# Patient Record
Sex: Female | Born: 1990 | Race: Black or African American | Hispanic: No | Smoking: Never smoker
Health system: Southern US, Community
[De-identification: ages and names within clinical notes are randomized; demographics above are authoritative.]

## PROBLEM LIST (undated history)

## (undated) DIAGNOSIS — F32A Depression, unspecified: Secondary | ICD-10-CM

## (undated) DIAGNOSIS — B009 Herpesviral infection, unspecified: Secondary | ICD-10-CM

## (undated) DIAGNOSIS — F329 Major depressive disorder, single episode, unspecified: Secondary | ICD-10-CM

## (undated) HISTORY — PX: INDUCED ABORTION: SHX677

## (undated) HISTORY — DX: Depression, unspecified: F32.A

## (undated) HISTORY — PX: WISDOM TOOTH EXTRACTION: SHX21

---
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
## 2013-02-22 ENCOUNTER — Encounter (HOSPITAL_COMMUNITY): Payer: Self-pay | Admitting: *Deleted

## 2013-02-22 ENCOUNTER — Inpatient Hospital Stay (HOSPITAL_COMMUNITY)
Admission: AD | Admit: 2013-02-22 | Discharge: 2013-02-22 | Disposition: A | Discharge status: Home / Self Care | Source: Ambulatory Visit | Attending: Obstetrics and Gynecology | Admitting: Obstetrics and Gynecology

## 2013-02-22 DIAGNOSIS — N912 Amenorrhea, unspecified: Secondary | ICD-10-CM

## 2013-02-22 DIAGNOSIS — Z3202 Encounter for pregnancy test, result negative: Secondary | ICD-10-CM | POA: Insufficient documentation

## 2013-02-22 LAB — URINALYSIS, ROUTINE W REFLEX MICROSCOPIC
Glucose, UA: NEGATIVE mg/dL
Hgb urine dipstick: NEGATIVE
Ketones, ur: NEGATIVE mg/dL
Leukocytes, UA: NEGATIVE
Nitrite: NEGATIVE
Protein, ur: NEGATIVE mg/dL
pH: 7 (ref 5.0–8.0)

## 2013-02-22 LAB — WET PREP, GENITAL
Clue Cells Wet Prep HPF POC: NONE SEEN
Trich, Wet Prep: NONE SEEN

## 2013-02-22 MED ORDER — METRONIDAZOLE 500 MG PO TABS: 500.0000 mg | ORAL_TABLET | Freq: Two times a day (BID) | ORAL | Status: DC

## 2013-02-22 NOTE — MAU Provider Note (Signed)
History     CSN: [id number]  Arrival date and time: 02/22/13 1446   First Provider Initiated Contact with Patient 02/22/13 1556      Chief Complaint  Patient presents with  . missed period    HPI  Pt is not pregnant and is G2P1with recent Misoprostol from [facility] on Aug 13 and bled from 13-29.  Pt then did not have a period and had a positive urine pregnancy test on Sept 29.  Pt has not taken another pregnancy test but has not had a period.  Pt has had Mirena IUD and had that removed and then did NuvaRing until December.  Pt denies vaginal discharge, spotting or  Bleeding. RN note: Patient presents to MAU today for a missed period; states unsure of last period; + HPT at home on 9/15. Patient states had +HPT on Aug 1st and [facility] [place] [facility]. States "I took at pill on Aug 13 and bled and cramped from Aug 13-Aug 29". [facility] and was told had possible tissue remaining? States was advised to come to the hospital [facility]. Denies bleeding nor pain at this time.  History reviewed. No pertinent past medical history.  History reviewed. No pertinent past surgical history.  History reviewed. No pertinent family history.  History  Substance Use Topics  . Smoking status: Never Smoker   . Smokeless tobacco: Never Used  . Alcohol Use: No    Allergies: No Known Allergies  Prescriptions prior to admission  Medication Sig Dispense Refill  . acetaminophen (TYLENOL) 325 MG tablet Take 650 mg by mouth every 6 (six) hours as needed for pain (headache).        Review of Systems  Constitutional: Negative for fever and chills.  Gastrointestinal: Negative for nausea, vomiting, abdominal pain, diarrhea and constipation.  Genitourinary: Negative for dysuria.   Physical Exam   Blood pressure 115/63, pulse 60, temperature 98.6 F (37 C),  temperature source Oral, resp. rate 18, height 5\' 5"  (1.651 m), SpO2 100.00%.  Physical Exam  Nursing note and vitals reviewed. Constitutional: She is oriented to person, place, and time. She appears well-developed and well-nourished. No distress.  HENT:  Head: Normocephalic.  Eyes: Pupils are equal, round, and reactive to light.  Neck: Normal range of motion. Neck supple.  Cardiovascular: Normal rate.   Respiratory: Effort normal.  GI: Soft. She exhibits no distension. There is no tenderness. There is no rebound and no guarding.  Genitourinary: Vagina normal and uterus normal. No vaginal discharge found.  Musculoskeletal: Normal range of motion.  Neurological: She is alert and oriented to person, place, and time.  Skin: Skin is warm and dry.  Psychiatric: She has a normal mood and affect.    MAU Course  Procedures Results for orders placed during the hospital encounter of 02/22/13 (from the past 24 hour(s))  URINALYSIS, ROUTINE W REFLEX MICROSCOPIC     Status: Abnormal   Collection Time    02/22/13  3:25 PM      Result Value Range   Color, Urine YELLOW  YELLOW   APPearance HAZY (*) CLEAR   Specific Gravity, Urine 1.020  1.005 - 1.030   pH 7.0  5.0 - 8.0   Glucose, UA NEGATIVE  NEGATIVE mg/dL   Hgb urine dipstick NEGATIVE  NEGATIVE   Bilirubin Urine NEGATIVE  NEGATIVE   Ketones, ur NEGATIVE  NEGATIVE mg/dL   Protein, ur NEGATIVE  NEGATIVE mg/dL   Urobilinogen, UA 0.2  0.0 - 1.0 mg/dL   Nitrite NEGATIVE  NEGATIVE   Leukocytes, UA NEGATIVE  NEGATIVE  POCT PREGNANCY, URINE     Status: None   Collection Time    02/22/13  3:47 PM      Result Value Range   Preg Test, Ur NEGATIVE  NEGATIVE  WET PREP, GENITAL     Status: Abnormal   Collection Time    02/22/13  4:10 PM      Result Value Range   Yeast Wet Prep HPF POC NONE SEEN  NONE SEEN   Trich, Wet Prep NONE SEEN  NONE SEEN   Clue Cells Wet Prep HPF POC NONE SEEN  NONE SEEN   WBC, Wet Prep HPF POC MODERATE (*) NONE SEEN     Assessment and Plan  Amenorrhea post misoprostol Negative pregnancy test Resume NuvaRing today and f/u with [facility] for Mirena (per pt choice)   Mayfield Schoene 02/22/2013, 3:58 PM

## 2013-02-22 NOTE — MAU Note (Signed)
Patient presents to MAU today for a missed period; states unsure of last period; + HPT at home on 9/15. Patient states had +HPT on Aug 1st and [facility] [place] [facility]. States "I took at pill on Aug 13 and bled and cramped from Aug 13-Aug 29". [facility] and was told had possible tissue remaining? States was advised to come to the [facility]. Denies bleeding nor pain at this time.

## 2013-02-23 LAB — GC/CHLAMYDIA PROBE AMP
CT Probe RNA: NEGATIVE
GC Probe RNA: NEGATIVE

## 2013-02-25 NOTE — MAU Provider Note (Signed)
Attestation of Attending Supervision of Advanced Practitioner (CNM/NP): Evaluation and management procedures were performed by the Advanced Practitioner under my supervision and collaboration.  I have reviewed the Advanced Practitioner's note and chart, and I agree with the management and plan.  Gracyn Santillanes 02/25/2013 2:17 PM

## 2013-03-02 ENCOUNTER — Emergency Department (INDEPENDENT_AMBULATORY_CARE_PROVIDER_SITE_OTHER)
Admission: EM | Admit: 2013-03-02 | Discharge: 2013-03-02 | Disposition: A | Discharge status: Home / Self Care | Source: Home / Self Care | Attending: Emergency Medicine | Admitting: Emergency Medicine

## 2013-03-02 ENCOUNTER — Encounter (HOSPITAL_COMMUNITY): Payer: Self-pay | Admitting: Emergency Medicine

## 2013-03-02 DIAGNOSIS — R05 Cough: Secondary | ICD-10-CM

## 2013-03-02 DIAGNOSIS — J00 Acute nasopharyngitis [common cold]: Secondary | ICD-10-CM

## 2013-03-02 DIAGNOSIS — R059 Cough, unspecified: Secondary | ICD-10-CM

## 2013-03-02 LAB — POCT RAPID STREP A: Streptococcus, Group A Screen (Direct): NEGATIVE

## 2013-03-02 MED ORDER — PSEUDOEPH-BROMPHEN-DM 30-2-10 MG/5ML PO SYRP: 10.0000 mL | ORAL_SOLUTION | Freq: Four times a day (QID) | ORAL | Status: DC | PRN

## 2013-03-02 MED ORDER — METHYLPREDNISOLONE 4 MG PO KIT: PACK | ORAL | Status: DC

## 2013-03-02 MED ORDER — FLUTICASONE PROPIONATE 50 MCG/ACT NA SUSP: 2.0000 | Freq: Every day | NASAL | Status: DC

## 2013-03-02 NOTE — ED Provider Notes (Signed)
CSN: [id number]     Arrival date & time 03/02/13  1542 History   First MD Initiated Contact with Patient 03/02/13 1651     Chief Complaint  Patient presents with  . Sore Throat   (Consider location/radiation/quality/duration/timing/severity/associated sxs/prior Treatment) HPI Comments: 22 year old female presents complaining of 3 days of sore throat, productive cough, and having lost her voice. She also has nasal congestion and some slight pressure in her sinuses. This is been aggravated by cold air. She has been taking over-the-counter medications with minimal relief. She denies fever, chills, NVD, chest pain, shortness of breath.  Patient is a 22 y.o. female presenting with pharyngitis.  Sore Throat Pertinent negatives include no chest pain, no abdominal pain and no shortness of breath.    History reviewed. No pertinent past medical history. History reviewed. No pertinent past surgical history. No family history on file. History  Substance Use Topics  . Smoking status: Never Smoker   . Smokeless tobacco: Never Used  . Alcohol Use: No   OB History   Grav Para Term Preterm Abortions TAB SAB Ect Mult Living   1 1 1       1      Review of Systems  Constitutional: Negative for fever and chills.  HENT: Positive for congestion, sinus pressure and sore throat.   Eyes: Negative for visual disturbance.  Respiratory: Positive for cough. Negative for shortness of breath.   Cardiovascular: Negative for chest pain, palpitations and leg swelling.  Gastrointestinal: Negative for nausea, vomiting and abdominal pain.  Endocrine: Negative for polydipsia and polyuria.  Genitourinary: Negative for dysuria, urgency and frequency.  Musculoskeletal: Negative for arthralgias and myalgias.  Skin: Negative for rash.  Neurological: Negative for dizziness, weakness and light-headedness.    Allergies  Review of patient's allergies indicates no known allergies.  Home Medications   Current  Outpatient Rx  Name  Route  Sig  Dispense  Refill  . acetaminophen (TYLENOL) 325 MG tablet   Oral   Take 650 mg by mouth every 6 (six) hours as needed for pain (headache).         . brompheniramine-pseudoephedrine-DM 30-2-10 MG/5ML syrup   Oral   Take 10 mLs by mouth 4 (four) times daily as needed.   120 mL   0   . fluticasone (FLONASE) 50 MCG/ACT nasal spray   Nasal   Place 2 sprays into the nose daily.   1 g   2   . methylPREDNISolone (MEDROL DOSEPAK) 4 MG tablet      follow package directions   21 tablet   0     Dispense as written.    BP 105/57  Pulse 64  Temp(Src) 98.7 F (37.1 C) (Oral)  Resp 18  SpO2 100%  LMP 12/21/2012 Physical Exam  Nursing note and vitals reviewed. Constitutional: She is oriented to person, place, and time. Vital signs are normal. She appears well-developed and well-nourished. No distress.  HENT:  Head: Normocephalic and atraumatic.  Right Ear: External ear normal.  Nose: Nose normal.  Mouth/Throat: Oropharynx is clear and moist. No oropharyngeal exudate.  Eyes: Pupils are equal, round, and reactive to light. No scleral icterus.  Neck: Normal range of motion. Neck supple.  Cardiovascular: Normal rate, regular rhythm and normal heart sounds.  Exam reveals no gallop and no friction rub.   No murmur heard. Pulmonary/Chest: Effort normal and breath sounds normal. No respiratory distress. She has no wheezes. She has no rales.  Abdominal: Soft. There is no tenderness.  Lymphadenopathy:    She has no cervical adenopathy.  Neurological: She is alert and oriented to person, place, and time. She has normal strength. Coordination normal.  Skin: Skin is warm and dry. No rash noted. She is not diaphoretic.  Psychiatric: She has a normal mood and affect. Judgment normal.    ED Course  Procedures (including critical care time) Labs Review Labs Reviewed - No data to display Imaging Review No results found.    MDM   1. Acute  nasopharyngitis (common cold)   2. Cough    Common cold. Treating symptomatically. Followup as needed.   Meds ordered this encounter  Medications  . methylPREDNISolone (MEDROL DOSEPAK) 4 MG tablet    Sig: follow package directions    Dispense:  21 tablet    Refill:  0    Order Specific Question:  Supervising Provider    Answer:  Lorenz Coaster, DAVID C [id number]  . fluticasone (FLONASE) 50 MCG/ACT nasal spray    Sig: Place 2 sprays into the nose daily.    Dispense:  1 g    Refill:  2    Order Specific Question:  Supervising Provider    Answer:  Lorenz Coaster, DAVID C [id number]  . brompheniramine-pseudoephedrine-DM 30-2-10 MG/5ML syrup    Sig: Take 10 mLs by mouth 4 (four) times daily as needed.    Dispense:  120 mL    Refill:  0    Order Specific Question:  Supervising Provider    Answer:  Lorenz Coaster, DAVID C [[id number]]       Graylon Good, PA-C 03/02/13 [phone]

## 2013-03-02 NOTE — ED Provider Notes (Signed)
Medical screening examination/treatment/procedure(s) were performed by non-physician practitioner and as supervising physician I was immediately available for consultation/collaboration.  Kimberla Driskill, M.D.  Jaishaun Mcnab C Cayleen Benjamin, MD 03/02/13 2113 

## 2013-03-02 NOTE — ED Notes (Signed)
Pt c/o sore throat onset 3 days This am woke up worse Sxs include: odynophagia, cough w/green phlegm Denies: f/v/n/d, SOB, wheezing Alert w/no signs of acute distress.

## 2013-03-04 LAB — CULTURE, GROUP A STREP

## 2013-03-04 NOTE — ED Notes (Signed)
Final report of group A strep screening negative for group A strep

## 2013-05-08 ENCOUNTER — Emergency Department: Payer: Self-pay | Admitting: Emergency Medicine

## 2013-05-08 LAB — URINALYSIS, COMPLETE
Bilirubin,UR: NEGATIVE
Glucose,UR: NEGATIVE mg/dL (ref 0–75)
Nitrite: NEGATIVE
Ph: 5 (ref 4.5–8.0)
Specific Gravity: 1.028 (ref 1.003–1.030)
Squamous Epithelial: 14

## 2014-03-10 ENCOUNTER — Encounter (HOSPITAL_COMMUNITY): Payer: Self-pay | Admitting: Emergency Medicine

## 2015-05-23 ENCOUNTER — Encounter: Payer: Self-pay | Admitting: Intensive Care

## 2015-05-23 ENCOUNTER — Emergency Department
Admission: EM | Admit: 2015-05-23 | Discharge: 2015-05-23 | Disposition: A | Discharge status: Home / Self Care | Payer: 59 | Attending: Emergency Medicine | Admitting: Emergency Medicine

## 2015-05-23 DIAGNOSIS — Y998 Other external cause status: Secondary | ICD-10-CM | POA: Insufficient documentation

## 2015-05-23 DIAGNOSIS — Y9289 Other specified places as the place of occurrence of the external cause: Secondary | ICD-10-CM | POA: Diagnosis not present

## 2015-05-23 DIAGNOSIS — Z23 Encounter for immunization: Secondary | ICD-10-CM | POA: Diagnosis not present

## 2015-05-23 DIAGNOSIS — S81811A Laceration without foreign body, right lower leg, initial encounter: Secondary | ICD-10-CM | POA: Diagnosis present

## 2015-05-23 DIAGNOSIS — Z7952 Long term (current) use of systemic steroids: Secondary | ICD-10-CM | POA: Insufficient documentation

## 2015-05-23 DIAGNOSIS — Y9389 Activity, other specified: Secondary | ICD-10-CM | POA: Diagnosis not present

## 2015-05-23 DIAGNOSIS — Z7951 Long term (current) use of inhaled steroids: Secondary | ICD-10-CM | POA: Diagnosis not present

## 2015-05-23 MED ORDER — TETANUS-DIPHTHERIA TOXOIDS TD 5-2 LFU IM INJ: 0.5000 mL | INJECTION | Freq: Once | INTRAMUSCULAR | Status: DC

## 2015-05-23 MED ORDER — LIDOCAINE-EPINEPHRINE (PF) 1 %-1:200000 IJ SOLN
INTRAMUSCULAR | Status: AC
  Filled 2015-05-23: qty 30

## 2015-05-23 MED ORDER — HYDROMORPHONE HCL 1 MG/ML IJ SOLN
1.0000 mg | Freq: Once | INTRAMUSCULAR | Status: AC
  Administered 2015-05-23: 1 mg via INTRAVENOUS

## 2015-05-23 MED ORDER — ONDANSETRON HCL 4 MG/2ML IJ SOLN
4.0000 mg | Freq: Once | INTRAMUSCULAR | Status: AC
  Administered 2015-05-23: 4 mg via INTRAVENOUS
  Filled 2015-05-23: qty 2

## 2015-05-23 MED ORDER — OXYCODONE-ACETAMINOPHEN 7.5-325 MG PO TABS: 1.0000 | ORAL_TABLET | ORAL | Status: DC | PRN

## 2015-05-23 MED ORDER — HYDROMORPHONE HCL 1 MG/ML IJ SOLN
1.0000 mg | Freq: Once | INTRAMUSCULAR | Status: DC
  Filled 2015-05-23: qty 1

## 2015-05-23 MED ORDER — TETANUS-DIPHTH-ACELL PERTUSSIS 5-2.5-18.5 LF-MCG/0.5 IM SUSP
0.5000 mL | Freq: Once | INTRAMUSCULAR | Status: AC
  Administered 2015-05-23: 0.5 mL via INTRAMUSCULAR
  Filled 2015-05-23: qty 0.5
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
## 2015-05-23 NOTE — Discharge Instructions (Signed)
Ambulate with crutches for 3-5 days as needed. Laceration Care, Adult A laceration is a cut that goes through all layers of the skin. The cut also goes into the tissue that is right under the skin. Some cuts heal on their own. Others need to be closed with stitches (sutures), staples, skin adhesive strips, or wound glue. Taking care of your cut lowers your risk of infection and helps your cut to heal better. HOW TO TAKE CARE OF YOUR CUT For stitches or staples:  Keep the wound clean and dry.  If you were given a bandage (dressing), you should change it at least one time per day or as told by your doctor. You should also change it if it gets wet or dirty.  Keep the wound completely dry for the first 24 hours or as told by your doctor. After that time, you may take a shower or a bath. However, make sure that the wound is not soaked in water until after the stitches or staples have been removed.  Clean the wound one time each day or as told by your doctor:  Wash the wound with soap and water.  Rinse the wound with water until all of the soap comes off.  Pat the wound dry with a clean towel. Do not rub the wound.  After you clean the wound, put a thin layer of antibiotic ointment on it as told by your doctor. This ointment:  Helps to prevent infection.  Keeps the bandage from sticking to the wound.  Have your stitches or staples removed as told by your doctor. If your doctor used skin adhesive strips:   Keep the wound clean and dry.  If you were given a bandage, you should change it at least one time per day or as told by your doctor. You should also change it if it gets dirty or wet.  Do not get the skin adhesive strips wet. You can take a shower or a bath, but be careful to keep the wound dry.  If the wound gets wet, pat it dry with a clean towel. Do not rub the wound.  Skin adhesive strips fall off on their own. You can trim the strips as the wound heals. Do not remove any strips  that are still stuck to the wound. They will fall off after a while. If your doctor used wound glue:  Try to keep your wound dry, but you may briefly wet it in the shower or bath. Do not soak the wound in water, such as by swimming.  After you take a shower or a bath, gently pat the wound dry with a clean towel. Do not rub the wound.  Do not do any activities that will make you really sweaty until the skin glue has fallen off on its own.  Do not apply liquid, cream, or ointment medicine to your wound while the skin glue is still on.  If you were given a bandage, you should change it at least one time per day or as told by your doctor. You should also change it if it gets dirty or wet.  If a bandage is placed over the wound, do not let the tape for the bandage touch the skin glue.  Do not pick at the glue. The skin glue usually stays on for 5-10 days. Then, it falls off of the skin. General Instructions  To help prevent scarring, make sure to cover your wound with sunscreen whenever you are outside after  stitches are removed, after adhesive strips are removed, or when wound glue stays in place and the wound is healed. Make sure to wear a sunscreen of at least 30 SPF.  Take over-the-counter and prescription medicines only as told by your doctor.  If you were given antibiotic medicine or ointment, take or apply it as told by your doctor. Do not stop using the antibiotic even if your wound is getting better.  Do not scratch or pick at the wound.  Keep all follow-up visits as told by your doctor. This is important.  Check your wound every day for signs of infection. Watch for:  Redness, swelling, or pain.  Fluid, blood, or pus.  Raise (elevate) the injured area above the level of your heart while you are sitting or lying down, if possible. GET HELP IF:  You got a tetanus shot and you have any of these problems at the injection site:  Swelling.  Very bad  pain.  Redness.  Bleeding.  You have a fever.  A wound that was closed breaks open.  You notice a bad smell coming from your wound or your bandage.  You notice something coming out of the wound, such as wood or glass.  Medicine does not help your pain.  You have more redness, swelling, or pain at the site of your wound.  You have fluid, blood, or pus coming from your wound.  You notice a change in the color of your skin near your wound.  You need to change the bandage often because fluid, blood, or pus is coming from the wound.  You start to have a new rash.  You start to have numbness around the wound. GET HELP RIGHT AWAY IF:  You have very bad swelling around the wound.  Your pain suddenly gets worse and is very bad.  You notice painful lumps near the wound or on skin that is anywhere on your body.  You have a red streak going away from your wound.  The wound is on your hand or foot and you cannot move a finger or toe like you usually can.  The wound is on your hand or foot and you notice that your fingers or toes look pale or bluish.   This information is not intended to replace advice given to you by your health care provider. Make sure you discuss any questions you have with your health care provider.   Document Released: 10/12/2007 Document Revised: 09/09/2014 Document Reviewed: 04/21/2014 Elsevier Interactive Patient Education [employer]2016 [employer]

## 2015-05-26 ENCOUNTER — Emergency Department
Admission: EM | Admit: 2015-05-26 | Discharge: 2015-05-26 | Disposition: A | Discharge status: Home / Self Care | Payer: 59 | Attending: Emergency Medicine | Admitting: Emergency Medicine

## 2015-05-26 DIAGNOSIS — Z7951 Long term (current) use of inhaled steroids: Secondary | ICD-10-CM | POA: Insufficient documentation

## 2015-05-26 DIAGNOSIS — Z7952 Long term (current) use of systemic steroids: Secondary | ICD-10-CM | POA: Diagnosis not present

## 2015-05-26 DIAGNOSIS — Z4801 Encounter for change or removal of surgical wound dressing: Secondary | ICD-10-CM | POA: Diagnosis present

## 2015-05-26 DIAGNOSIS — IMO0002 Reserved for concepts with insufficient information to code with codable children: Secondary | ICD-10-CM

## 2015-05-26 NOTE — ED Notes (Signed)
Pt was seen Saturday for a laceration to her right leg and was advised to come back for a recheck today. Pt reports just painful.

## 2015-05-26 NOTE — ED Notes (Signed)
States she is her for wound check to left lower leg s/p laceration repair   Staples intact and site is clear

## 2015-05-26 NOTE — ED Provider Notes (Signed)
[facility] Emergency Department Provider Note  ____________________________________________  Time seen: Approximately 10:51 AM  I have reviewed the triage vital signs and the nursing notes.   HISTORY  Chief Complaint Wound Check    HPI Samantha Mcintosh is a 25 y.o. female patient here for evaluation wound check secondary to a laceration of the right lower leg. Patient state moderate pain secondary to procedure. Patient is nose no signs of infection. Patient rated her pain as a 6/10. Scattered take antibiotics and pain medication as directed.   No past medical history on file.  There are no active problems to display for this patient.   No past surgical history on file.  Current Outpatient Rx  Name  Route  Sig  Dispense  Refill  . acetaminophen (TYLENOL) 325 MG tablet   Oral   Take 650 mg by mouth every 6 (six) hours as needed for pain (headache).         . brompheniramine-pseudoephedrine-DM 30-2-10 MG/5ML syrup   Oral   Take 10 mLs by mouth 4 (four) times daily as needed.   120 mL   0   . fluticasone (FLONASE) 50 MCG/ACT nasal spray   Nasal   Place 2 sprays into the nose daily.   1 g   2   . methylPREDNISolone (MEDROL DOSEPAK) 4 MG tablet      follow package directions   21 tablet   0     Dispense as written.   Marland Kitchen oxyCODONE-acetaminophen (PERCOCET) 7.5-325 MG tablet   Oral   Take 1 tablet by mouth every 4 (four) hours as needed for severe pain.   20 tablet   0     Allergies Review of patient's allergies indicates no known allergies.  No family history on file.  Social History Social History  Substance Use Topics  . Smoking status: Never Smoker   . Smokeless tobacco: Never Used  . Alcohol Use: No    Review of Systems Constitutional: No fever/chills Eyes: No visual changes. ENT: No sore throat. Cardiovascular: Denies chest pain. Respiratory: Denies shortness of breath. Gastrointestinal: No abdominal pain.  No  nausea, no vomiting.  No diarrhea.  No constipation. Genitourinary: Negative for dysuria. Musculoskeletal: Negative for back pain. Skin: Negative for rash. Right lower leg laceration Neurological: Negative for headaches, focal weakness or numbness. 10-point ROS otherwise negative.  ____________________________________________   PHYSICAL EXAM:  VITAL SIGNS: ED Triage Vitals  Enc Vitals Group     BP 05/26/15 1023 111/55 mmHg     Pulse Rate 05/26/15 1023 78     Resp 05/26/15 1023 20     Temp 05/26/15 1023 98.1 F (36.7 C)     Temp Source 05/26/15 1023 Oral     SpO2 05/26/15 1023 100 %     Weight 05/26/15 1023 175 lb (79.379 kg)     Height 05/26/15 1023  (1.651 m)     Head Cir --      Peak Flow --      Pain Score 05/26/15 1023 6     Pain Loc --      Pain Edu? --      Excl. in GC? --     Constitutional: Alert and oriented. Well appearing and in no acute distress. Eyes: Conjunctivae are normal. PERRL. EOMI. Head: Atraumatic. Nose: No congestion/rhinnorhea. Mouth/Throat: Mucous membranes are moist.  Oropharynx non-erythematous. Neck: No stridor.  No cervical spine tenderness to palpation. Hematological/Lymphatic/Immunilogical: No cervical lymphadenopathy. Cardiovascular: Normal rate, regular rhythm.  Grossly normal heart sounds.  Good peripheral circulation. Respiratory: Normal respiratory effort.  No retractions. Lungs CTAB. Gastrointestinal: Soft and nontender. No distention. No abdominal bruits. No CVA tenderness. Musculoskeletal: No lower extremity tenderness nor edema.  No joint effusions. Neurologic:  Normal speech and language. No gross focal neurologic deficits are appreciated. No gait instability. Skin:  Skin is warm, dry and intact. No rash noted. Healing laceration right lower leg with no signs symptoms secondary infection. Psychiatric: Mood and affect are normal. Speech and behavior are normal.  ____________________________________________   LABS (all labs  ordered are listed, but only abnormal results are displayed)  Labs Reviewed - No data to display ____________________________________________  EKG   ____________________________________________  RADIOLOGY   ____________________________________________   PROCEDURES  Procedure(s) performed: None  Critical Care performed: No  ____________________________________________   INITIAL IMPRESSION / ASSESSMENT AND PLAN / ED COURSE  Pertinent labs & imaging results that were available during my care of the patient were reviewed by me and considered in my medical decision making (see chart for details).  Patient here for wound check secondary to laceration the right lower leg. Movement of the dressing shows no discharge some mild edema and no obvious erythema. Patient neurovascular intact. ____________________________________________   FINAL CLINICAL IMPRESSION(S) / ED DIAGNOSES  Final diagnoses:  Encounter for re-check of laceration wound      Joni Reining, PA-C 05/26/15 1058  Rockne Menghini, MD 05/26/15 [phone]

## 2016-05-28 ENCOUNTER — Encounter: Payer: Self-pay | Admitting: Emergency Medicine

## 2016-05-28 DIAGNOSIS — F17200 Nicotine dependence, unspecified, uncomplicated: Secondary | ICD-10-CM | POA: Insufficient documentation

## 2016-05-28 DIAGNOSIS — Z79899 Other long term (current) drug therapy: Secondary | ICD-10-CM | POA: Diagnosis not present

## 2016-05-28 DIAGNOSIS — R1031 Right lower quadrant pain: Secondary | ICD-10-CM | POA: Diagnosis present

## 2016-05-28 DIAGNOSIS — K5900 Constipation, unspecified: Secondary | ICD-10-CM | POA: Diagnosis not present

## 2016-05-28 LAB — COMPREHENSIVE METABOLIC PANEL
ALT: 20 U/L (ref 14–54)
AST: 22 U/L (ref 15–41)
Albumin: 4.3 g/dL (ref 3.5–5.0)
Alkaline Phosphatase: 60 U/L (ref 38–126)
Anion gap: 5 (ref 5–15)
BILIRUBIN TOTAL: 0.3 mg/dL (ref 0.3–1.2)
BUN: 10 mg/dL (ref 6–20)
CO2: 27 mmol/L (ref 22–32)
Calcium: 9.4 mg/dL (ref 8.9–10.3)
Chloride: 108 mmol/L (ref 101–111)
Creatinine, Ser: 0.99 mg/dL (ref 0.44–1.00)
GFR calc Af Amer: >60 mL/min (ref >60)
GFR calc non Af Amer: >60 mL/min (ref >60)
Glucose, Bld: 76 mg/dL (ref 65–99)
POTASSIUM: 3.7 mmol/L (ref 3.5–5.1)
SODIUM: 140 mmol/L (ref 135–145)
TOTAL PROTEIN: 7.6 g/dL (ref 6.5–8.1)

## 2016-05-28 LAB — CBC
HEMATOCRIT: 36.9 % (ref 35.0–47.0)
HEMOGLOBIN: 12.7 g/dL (ref 12.0–16.0)
MCH: 29.8 pg (ref 26.0–34.0)
MCHC: 34.3 g/dL (ref 32.0–36.0)
MCV: 86.8 fL (ref 80.0–100.0)
Platelets: 285 10*3/uL (ref 150–440)
RBC: 4.25 MIL/uL (ref 3.80–5.20)
RDW: 13.5 % (ref 11.5–14.5)
WBC: 6.2 10*3/uL (ref 3.6–11.0)

## 2016-05-28 LAB — URINALYSIS, COMPLETE (UACMP) WITH MICROSCOPIC
BACTERIA UA: NONE SEEN
Bilirubin Urine: NEGATIVE
GLUCOSE, UA: NEGATIVE mg/dL
Hgb urine dipstick: NEGATIVE
KETONES UR: NEGATIVE mg/dL
Leukocytes, UA: NEGATIVE
Nitrite: NEGATIVE
PROTEIN: NEGATIVE mg/dL
RBC / HPF: NONE SEEN RBC/hpf (ref 0–5)
Specific Gravity, Urine: 1.017 (ref 1.005–1.030)
WBC UA: NONE SEEN WBC/hpf (ref 0–5)
pH: 7 (ref 5.0–8.0)

## 2016-05-28 LAB — LIPASE, BLOOD: Lipase: 22 U/L (ref 11–51)

## 2016-05-28 LAB — POCT PREGNANCY, URINE: Preg Test, Ur: NEGATIVE

## 2016-05-28 NOTE — ED Triage Notes (Signed)
Patient states that she developed right lower abdominal pain around 15:00 with nausea.

## 2016-05-29 ENCOUNTER — Emergency Department
Admission: EM | Admit: 2016-05-29 | Discharge: 2016-05-29 | Disposition: A | Discharge status: Home / Self Care | Payer: 59 | Attending: Emergency Medicine | Admitting: Emergency Medicine

## 2016-05-29 ENCOUNTER — Emergency Department: Payer: 59

## 2016-05-29 ENCOUNTER — Encounter: Payer: Self-pay | Admitting: Radiology

## 2016-05-29 DIAGNOSIS — K5900 Constipation, unspecified: Secondary | ICD-10-CM

## 2016-05-29 DIAGNOSIS — R109 Unspecified abdominal pain: Secondary | ICD-10-CM

## 2016-05-29 MED ORDER — IOPAMIDOL (ISOVUE-300) INJECTION 61%
100.0000 mL | Freq: Once | INTRAVENOUS | Status: AC | PRN
  Administered 2016-05-29: 100 mL via INTRAVENOUS

## 2016-05-29 MED ORDER — ACETAMINOPHEN 500 MG PO TABS
1000.0000 mg | ORAL_TABLET | ORAL | Status: AC
Start: 2016-05-29 — End: 2016-05-29
  Administered 2016-05-29: 1000 mg via ORAL
  Filled 2016-05-29 (×2): qty 2

## 2016-05-29 MED ORDER — IOPAMIDOL (ISOVUE-300) INJECTION 61%
30.0000 mL | Freq: Once | INTRAVENOUS | Status: AC
  Administered 2016-05-29: 30 mL via ORAL

## 2016-05-29 NOTE — ED Notes (Signed)
Pt is in good condition; discharge instructions reviewed; follow up care and home care reviewed; pt verbalized understanding; pt is ambulatory and went home by herself.

## 2016-05-29 NOTE — ED Notes (Signed)
Called CT and informed them that the Pt has finished drinking all of the contrast that she is willing to drink.  Pt has drank 1 full bottle and 3/4 of the 2nd bottle.

## 2016-05-29 NOTE — ED Notes (Signed)
Urine and POCT was completed by tech Kathe BectonYessica Aguas

## 2016-05-29 NOTE — Discharge Instructions (Signed)
? ?  Please return to the emergency room right away if you are to develop a fever, severe nausea, your pain becomes severe or worsens, you are unable to keep food down, begin vomiting any dark or bloody fluid, you develop any dark or bloody stools, feel dehydrated, or other new concerns or symptoms arise. ? ?

## 2016-05-29 NOTE — ED Provider Notes (Signed)
[facility] Emergency Department Provider Note   ____________________________________________   First MD Initiated Contact with Patient 05/29/16 0206     (approximate)  I have reviewed the triage vital signs and the nursing notes.   HISTORY  Chief Complaint Abdominal Pain    HPI Samantha HeckMackenzie L Mcintosh is a 26 y.o. 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 After taking Tylenol and resting in the waiting area she reports her pain has subsided somewhat continues to have a persistent for out of 10 "discomfort" is somewhat hard describe in the right mid abdomen. No fevers nausea vomiting diarrhea or constipation. Denies pregnancy. Has one child who is 24 years old. No vaginal discharge or pelvic pain.   History reviewed. No pertinent past medical history.  There are no active problems to display for this patient.   History reviewed. No pertinent surgical history.  Prior to Admission medications   Medication Sig Start Date End Date Taking? Authorizing Provider  acetaminophen (TYLENOL) 325 MG tablet Take 650 mg by mouth every 6 (six) hours as needed for pain (headache).    Historical Provider, MD  brompheniramine-pseudoephedrine-DM 30-2-10 MG/5ML syrup Take 10 mLs by mouth 4 (four) times daily as needed. 03/02/13   Adrian BlackwaterZachary H Baker, PA-C  fluticasone (FLONASE) 50 MCG/ACT nasal spray Place 2 sprays into the nose daily. 03/02/13   Graylon GoodZachary H Baker, PA-C  methylPREDNISolone (MEDROL DOSEPAK) 4 MG tablet follow package directions 03/02/13   Graylon GoodZachary H Baker, PA-C  oxyCODONE-acetaminophen (PERCOCET) 7.5-325 MG tablet Take 1 tablet by mouth every 4 (four) hours as needed for severe pain. 05/23/15   Joni Reiningonald K Smith, PA-C    Allergies Patient has no  known allergies.  No family history on file.  Social History Social History  Substance Use Topics  . Smoking status: Current Every Day Smoker  . Smokeless tobacco: Never Used  . Alcohol use No    Review of Systems Constitutional: No fever/chills Eyes: No visual changes. ENT: No sore throat. Cardiovascular: Denies chest pain. Respiratory: Denies shortness of breath. Gastrointestinal:   No nausea, no vomiting.  No diarrhea.  No constipation. Genitourinary: Negative for dysuria. Musculoskeletal: Negative for back pain. Skin: Negative for rash. Neurological: Negative for headaches, focal weakness or numbness.    10-point ROS otherwise negative.  ____________________________________________   PHYSICAL EXAM:  VITAL SIGNS: ED Triage Vitals  Enc Vitals Group     BP 05/28/16 2030 (!) 113/56     Pulse Rate 05/28/16 2029 63     Resp 05/28/16 2029 18     Temp 05/28/16 2030 98.2 F (36.8 C)     Temp Source 05/29/16 0116 Oral     SpO2 05/28/16 2029 100 %     Weight 05/28/16 2030 170 lb (77.1 kg)     Height 05/28/16 2030 5\' 5"  (1.651 m)     Head Circumference --      Peak Flow --      Pain Score 05/28/16 2030 7     Pain Loc --      Pain Edu? --      Excl. in GC? --     Constitutional: Alert and oriented. Well appearing and in no acute distress.Extremely pleasant. Eyes: Conjunctivae are normal. PERRL. EOMI. Head: Atraumatic. Nose: No congestion/rhinnorhea. Mouth/Throat: Mucous membranes are  moist.  Oropharynx non-erythematous. Neck: No stridor.   Cardiovascular: Normal rate, regular rhythm. Grossly normal heart sounds.  Good peripheral circulation. Respiratory: Normal respiratory effort.  No retractions. Lungs CTAB. Gastrointestinal: Soft and nontender except for mild discomfort under the right ribs and the right flank, without obvious focality. Minimal tenderness at McBurney's point, but no focality. No rebound or guarding. No left-sided tenderness.. No distention. No  abdominal bruits. No CVA tenderness. Musculoskeletal: No lower extremity tenderness nor edema.  No joint effusions. Neurologic:  Normal speech and language. No gross focal neurologic deficits are appreciated. No gait instability. Skin:  Skin is warm, dry and intact. No rash noted. Psychiatric: Mood and affect are normal. Speech and behavior are normal.  ____________________________________________   LABS (all labs ordered are listed, but only abnormal results are displayed)  Labs Reviewed  URINALYSIS, COMPLETE (UACMP) WITH MICROSCOPIC - Abnormal; Notable for the following:       Result Value   Color, Urine YELLOW (*)    APPearance CLOUDY (*)    Squamous Epithelial / LPF 0-5 (*)    All other components within normal limits  LIPASE, BLOOD  COMPREHENSIVE METABOLIC PANEL  CBC  POC URINE PREG, ED  POCT PREGNANCY, URINE   ____________________________________________  EKG   ____________________________________________  RADIOLOGY  Ct Abdomen Pelvis W Contrast  Result Date: 05/29/2016 CLINICAL DATA:  Right flank and abdominal pain. EXAM: CT ABDOMEN AND PELVIS WITH CONTRAST TECHNIQUE: Multidetector CT imaging of the abdomen and pelvis was performed using the standard protocol following bolus administration of intravenous contrast. CONTRAST:  ISOVUE-300 IOPAMIDOL (ISOVUE-300) INJECTION 61% COMPARISON:  None. FINDINGS: Lower chest: The lung bases are clear, allowing for motion artifact. Hepatobiliary: Focal fatty infiltration adjacent with falciform ligament. No suspicious focal lesion. Gallbladder physiologically distended, no calcified stone. No biliary dilatation. Pancreas: No ductal dilatation or inflammation. Spleen: Normal in size without focal abnormality. Adrenals/Urinary Tract: Normal adrenal glands. No hydronephrosis. Symmetric renal enhancement. No focal renal abnormality. Urinary bladder is distended, no wall thickening. Stomach/Bowel: Stomach is physiologically distended. No  small bowel wall thickening or edema. No bowel obstruction. Large volume of stool throughout the colon, primarily the right colon without colonic inflammation. There is sigmoid colonic tortuosity. The appendix is normal, best appreciated on coronal reformats. Vascular/Lymphatic: No significant vascular findings are present. No enlarged abdominal or pelvic lymph nodes. Reproductive: Uterus and bilateral adnexa are unremarkable. Other: Minimal free fluid in the pelvis is physiologic. No upper abdominal ascites. No free air. No intra-abdominal abscess. Musculoskeletal: There are no acute or suspicious osseous abnormalities. IMPRESSION: 1. No acute abnormality in the abdomen/pelvis. 2. Moderate diffuse stool burden suggesting constipation. Electronically Signed   By: Rubye Oaks M.D.   On: 05/29/2016 04:38    ____________________________________________   PROCEDURES  Procedure(s) performed: None  Procedures  Critical Care performed: No  ____________________________________________   INITIAL IMPRESSION / ASSESSMENT AND PLAN / ED COURSE  Pertinent labs & imaging results that were available during my care of the patient were reviewed by me and considered in my medical decision making (see chart for details).  Differential diagnosis includes but is not limited to, cholecystitis, appendicitis, diverticulitis, colitis, esophagitis/gastritis, kidney stone, pyelonephritis, urinary tract infection, aortic aneurysm. All are considered in decision and treatment plan. Based upon the patient's presentation and risk factors, given the right-sided tenderness and discussed obtaining an ultrasound versus CT scan to further evaluate. The patient reports moderate ongoing discomfort, and after shared medical decision making we'll proceed with CT scan to better visualize  for etiology such as cholelithiasis, kidney stones, appendicitis, colitis, etc.   ----------------------------------------- 6:48 AM on  05/29/2016 -----------------------------------------  Patient is resting Fluid. Actually sleeping on entry to the room, awakens and she reports feeling much improved. No further abdominal discomfort, repeat exam no rebound or guarding. No focal tenderness. Discussed with the patient, and she is comfortable with the plan for discharge, and careful return precautions.  Return precautions and treatment recommendations and follow-up discussed with the patient who is agreeable with the plan.      ____________________________________________   FINAL CLINICAL IMPRESSION(S) / ED DIAGNOSES  Final diagnoses:  Right lateral abdominal pain  Constipation, unspecified constipation type      NEW MEDICATIONS STARTED DURING THIS VISIT:  New Prescriptions   No medications on file     Note:  This document was prepared using Dragon voice recognition software and may include unintentional dictation errors.     Sharyn Creamer, MD 05/29/16 [phone]

## 2018-05-07 ENCOUNTER — Emergency Department (HOSPITAL_COMMUNITY): Payer: 59

## 2018-05-07 ENCOUNTER — Emergency Department (HOSPITAL_COMMUNITY)
Admission: EM | Admit: 2018-05-07 | Discharge: 2018-05-08 | Disposition: A | Discharge status: Home / Self Care | Payer: 59 | Attending: Emergency Medicine | Admitting: Emergency Medicine

## 2018-05-07 ENCOUNTER — Other Ambulatory Visit: Payer: Self-pay

## 2018-05-07 ENCOUNTER — Encounter (HOSPITAL_COMMUNITY): Payer: Self-pay | Admitting: Emergency Medicine

## 2018-05-07 DIAGNOSIS — R202 Paresthesia of skin: Secondary | ICD-10-CM | POA: Insufficient documentation

## 2018-05-07 DIAGNOSIS — R079 Chest pain, unspecified: Secondary | ICD-10-CM | POA: Diagnosis not present

## 2018-05-07 DIAGNOSIS — R0602 Shortness of breath: Secondary | ICD-10-CM | POA: Insufficient documentation

## 2018-05-07 DIAGNOSIS — Z79899 Other long term (current) drug therapy: Secondary | ICD-10-CM | POA: Diagnosis not present

## 2018-05-07 DIAGNOSIS — F17200 Nicotine dependence, unspecified, uncomplicated: Secondary | ICD-10-CM | POA: Diagnosis not present

## 2018-05-07 DIAGNOSIS — R002 Palpitations: Secondary | ICD-10-CM | POA: Diagnosis not present

## 2018-05-07 LAB — CBC
HCT: 37.5 % (ref 36.0–46.0)
Hemoglobin: 11.9 g/dL — ABNORMAL LOW (ref 12.0–15.0)
MCH: 28.5 pg (ref 26.0–34.0)
MCHC: 31.7 g/dL (ref 30.0–36.0)
MCV: 89.9 fL (ref 80.0–100.0)
PLATELETS: 349 10*3/uL (ref 150–400)
RBC: 4.17 MIL/uL (ref 3.87–5.11)
RDW: 12.8 % (ref 11.5–15.5)
WBC: 5.8 10*3/uL (ref 4.0–10.5)
nRBC: 0 % (ref 0.0–0.2)

## 2018-05-07 LAB — BASIC METABOLIC PANEL
Anion gap: 9 (ref 5–15)
BUN: 6 mg/dL (ref 6–20)
CALCIUM: 9.5 mg/dL (ref 8.9–10.3)
CO2: 26 mmol/L (ref 22–32)
Chloride: 104 mmol/L (ref 98–111)
Creatinine, Ser: 0.92 mg/dL (ref 0.44–1.00)
GFR calc Af Amer: >60 mL/min (ref >60)
GFR calc non Af Amer: >60 mL/min (ref >60)
Glucose, Bld: 95 mg/dL (ref 70–99)
POTASSIUM: 3.6 mmol/L (ref 3.5–5.1)
Sodium: 139 mmol/L (ref 135–145)

## 2018-05-07 LAB — I-STAT TROPONIN, ED: TROPONIN I, POC: 0 ng/mL (ref 0.00–0.08)

## 2018-05-07 LAB — I-STAT BETA HCG BLOOD, ED (MC, WL, AP ONLY): I-stat hCG, quantitative: <5 m[IU]/mL (ref <5)

## 2018-05-07 NOTE — ED Provider Notes (Signed)
TIME SEEN: 11:55 PM  CHIEF COMPLAINT: Palpitations, chest heaviness, right arm tingling  HPI: Patient is a [age] year old healthy female who presents to the emergency department with complaints of palpitations that occurred yesterday after drinking 2 cups of coffee with central chest heaviness.  Also had some right arm tingling in the shoulder that started 2 days ago.  Symptoms now completely resolved.  Was initially feeling short of breath but this is gone as well.  No numbness, tingling or focal weakness currently.  Did recently drive to [place] to see her son but states she did stop and get out of the car.  No fever, cough.  No family history of premature CAD.  She states she does have a grandfather who had an MI in his 6370s.  She quit smoking approximately 2 weeks ago.  No history of PE, DVT, exogenous estrogen use, recent fractures, surgery, trauma, hospitalization or prolonged travel. No lower extremity swelling or pain. No calf tenderness.  ROS: See HPI Constitutional: no fever  Eyes: no drainage  ENT: no runny nose   Cardiovascular:   chest pain  Resp:  SOB  GI: no vomiting GU: no dysuria Integumentary: no rash  Allergy: no hives  Musculoskeletal: no leg swelling  Neurological: no slurred speech ROS otherwise negative  PAST MEDICAL HISTORY/PAST SURGICAL HISTORY:  History reviewed. No pertinent past medical history.  MEDICATIONS:  Prior to Admission medications   Medication Sig Start Date End Date Taking? Authorizing Provider  acetaminophen (TYLENOL) 325 MG tablet Take 650 mg by mouth every 6 (six) hours as needed for pain (headache).    [provider]  brompheniramine-pseudoephedrine-DM 30-2-10 MG/5ML syrup Take 10 mLs by mouth 4 (four) times daily as needed. 03/02/13   Baker, Adrian BlackwaterZachary H, PA-C  fluticasone (FLONASE) 50 MCG/ACT nasal spray Place 2 sprays into the nose daily. 03/02/13   Graylon GoodBaker, Zachary H, PA-C  methylPREDNISolone (MEDROL DOSEPAK) 4 MG tablet follow package  directions 03/02/13   Excell SeltzerBaker, Adrian BlackwaterZachary H, PA-C  oxyCODONE-acetaminophen (PERCOCET) 7.5-325 MG tablet Take 1 tablet by mouth every 4 (four) hours as needed for severe pain. 05/23/15   Joni ReiningSmith, Ronald K, PA-C    ALLERGIES:  No Known Allergies  SOCIAL HISTORY:  Social History   Tobacco Use  . Smoking status: Current Every Day Smoker  . Smokeless tobacco: Never Used  Substance Use Topics  . Alcohol use: No    FAMILY HISTORY: No family history on file.  EXAM: BP 115/62   Pulse 64   Temp 98.5 F (36.9 C) (Oral)   Resp 14   Ht 5\' 5"  (1.651 m)   Wt 77.1 kg   SpO2 100%   BMI 28.29 kg/m  CONSTITUTIONAL: Alert and oriented and responds appropriately to questions. Well-appearing; well-nourished HEAD: Normocephalic EYES: Conjunctivae clear, pupils appear equal, EOMI ENT: normal nose; moist mucous membranes NECK: Supple, no meningismus, no nuchal rigidity, no LAD  CARD: RRR; S1 and S2 appreciated; no murmurs, no clicks, no rubs, no gallops RESP: Normal chest excursion without splinting or tachypnea; breath sounds clear and equal bilaterally; no wheezes, no rhonchi, no rales, no hypoxia or respiratory distress, speaking full sentences ABD/GI: Normal bowel sounds; non-distended; soft, non-tender, no rebound, no guarding, no peritoneal signs, no hepatosplenomegaly BACK:  The back appears normal and is non-tender to palpation, there is no CVA tenderness EXT: Normal ROM in all joints; non-tender to palpation; no edema; normal capillary refill; no cyanosis, no calf tenderness or swelling; 2+ radial pulses bilaterally SKIN: Normal color for age  and race; warm; no rash NEURO: Moves all extremities equally, strength 5/5 in all 4 extremities, cranial nerves II through XII intact, normal speech, sensation to light touch intact diffusely  pSYCH: The patient's mood and manner are appropriate. Grooming and personal hygiene are appropriate.  MEDICAL DECISION MAKING: Patient here with symptoms of right  shoulder tingling, palpitations, chest heaviness and shortness of breath that have completely resolved.  Low risk for ACS.  Troponin here negative and EKG shows no ischemic abnormality.  Electrolytes normal and normal hemoglobin.  EKG shows no arrhythmia or significant interval abnormality.  No delta waves, LVH, prolonged QT interval.  Patient is PERC negative.  Her chest x-ray is clear.  She is neurologically intact with strong equal pulses.  I feel she is safe to be discharged home with outpatient follow-up with the PCP as an outpatient and will give cardiology follow-up information as well if she continues to have the symptoms.  Discussed with patient that this may have been related to increased caffeine intake and have advised her to decrease her caffeine intake, increase her water intake and avoid other stimulants.   At this time, I do not feel there is any life-threatening condition present. I have reviewed and discussed all results (EKG, imaging, lab, urine as appropriate) and exam findings with patient/family. I have reviewed nursing notes and appropriate previous records.  I feel the patient is safe to be discharged home without further emergent workup and can continue workup as an outpatient as needed. Discussed usual and customary return precautions. Patient/family verbalize understanding and are comfortable with this plan.  Outpatient follow-up has been provided as needed. All questions have been answered.    EKG Interpretation  Date/Time:  Tuesday May 08 2018 00:00:13 EST Ventricular Rate:  57 PR Interval:    QRS Duration: 118 QT Interval:  432 QTC Calculation: 421 R Axis:   60 Text Interpretation:  Sinus rhythm Nonspecific intraventricular conduction delay No old tracing to compare Confirmed by Kelvis Berger, Baxter HireKristen [phone]([id number]) on 05/08/2018 12:05:14 AM             Jaythan Hinely, Layla MawKristen N, DO 05/08/18 0011

## 2018-05-07 NOTE — ED Notes (Signed)
Pt states that as she was working this afternoon she began having right arm tingling and chest pressure with shob that lasted 10 minutes. No pain now. Denies being on birth control but did have a 7 hour car ride home from [place] yesterday.

## 2018-05-07 NOTE — ED Triage Notes (Signed)
Pt states this am she drank two cups of coffee and after that she has experienced palpitations. Now she is experiencing right arm tingling and shortness of breath and a heaviness of the chest

## 2018-05-08 NOTE — ED Notes (Signed)
Pt verbalized understanding of d/c instructions and has no further questions, VSS, NAD.  

## 2018-05-08 NOTE — Discharge Instructions (Signed)
I recommend that you decrease your caffeine intake and avoid other stimulants.  I recommend that you increase your fluid intake and drink 60 to 80 ounces of water a day.  Your labs, EKG, chest x-ray were normal today.   Steps to find a Primary Care Provider (PCP):  Call [phone] or [phone] to access "[place] Find a Doctor Service."  2.  You may also go on the [facility] website at [email or web address]  3.  [facility] also frequently accepts new patients.  [facility]  [address] [place] [id number] [phone]  4.  There are also multiple [facility] [place] [facility] practices throughout the [place] that are frequently accepting new patients. You may find a clinic that is close to your home and contact them.  [email or web address] [phone]  [facility] [email or web address]  [email or web address] [phone]  [email or web address] [phone]  5.  Local Health Departments also can provide primary care services.  [facility]  [address]  [place] [phone]  [facility] [address] [place] [id number] [phone]  [facility] [address] [place] [address]  [place] [id number] [phone]

## 2019-02-02 ENCOUNTER — Other Ambulatory Visit: Payer: Self-pay

## 2019-02-02 ENCOUNTER — Encounter (HOSPITAL_COMMUNITY): Payer: Self-pay | Admitting: *Deleted

## 2019-02-02 ENCOUNTER — Ambulatory Visit (HOSPITAL_COMMUNITY)
Admission: EM | Admit: 2019-02-02 | Discharge: 2019-02-02 | Disposition: A | Discharge status: Home / Self Care | Payer: Medicaid Other | Attending: Family | Admitting: Family

## 2019-02-02 DIAGNOSIS — Z0289 Encounter for other administrative examinations: Secondary | ICD-10-CM | POA: Diagnosis not present

## 2019-02-02 DIAGNOSIS — Z20828 Contact with and (suspected) exposure to other viral communicable diseases: Secondary | ICD-10-CM | POA: Insufficient documentation

## 2019-02-02 DIAGNOSIS — Z87891 Personal history of nicotine dependence: Secondary | ICD-10-CM | POA: Insufficient documentation

## 2019-02-02 DIAGNOSIS — Z3A01 Less than 8 weeks gestation of pregnancy: Secondary | ICD-10-CM | POA: Diagnosis not present

## 2019-02-02 DIAGNOSIS — Z0189 Encounter for other specified special examinations: Secondary | ICD-10-CM | POA: Diagnosis present

## 2019-02-02 DIAGNOSIS — Z1159 Encounter for screening for other viral diseases: Secondary | ICD-10-CM | POA: Diagnosis not present

## 2019-02-02 DIAGNOSIS — Z3201 Encounter for pregnancy test, result positive: Secondary | ICD-10-CM

## 2019-02-02 LAB — POCT PREGNANCY, URINE: Preg Test, Ur: POSITIVE — AB

## 2019-02-02 NOTE — Discharge Instructions (Signed)
Your urine pregnancy test is positive.  Start taking prenatal vitamins today.    Your COVID test is pending.  You should self quarantine until your test result is back and is negative.    Go to the emergency department if you develop high fever, shortness of breath, severe diarrhea, or other concerning symptoms.

## 2019-02-02 NOTE — ED Triage Notes (Signed)
Pt reports having positive pregnancy test at home 6 days ago; has been experiencing morning sickness; states employer told her she needs a work note clearing her to return to work.

## 2019-02-02 NOTE — ED Provider Notes (Signed)
[facility]    CSN: [id number] Arrival date & time: 02/02/19  1614      History   Chief Complaint Chief Complaint  Patient presents with  . Letter for School/Work    HPI Samantha Mcintosh is a 28 y.o. female.   Patient presents with request for a COVID test.  She is having some nausea/vomiting.  She had a positive pregnancy test at home 6 days ago and requests a test today to confirm this.  Her work is requiring a COVID test before she can return.  Patient denies fever, chills, cough, shortness of breath, abdominal pain, diarrhea, or other symptoms.  LMP: 12/24/2018  The history is provided by the patient.    History reviewed. No pertinent past medical history.  There are no active problems to display for this patient.   History reviewed. No pertinent surgical history.  OB History    Gravida  1   Para  1   Term  1   Preterm      AB      Living  1     SAB      TAB      Ectopic      Multiple      Live Births  1            Home Medications    Prior to Admission medications   Medication Sig Start Date End Date Taking? Authorizing Provider  brompheniramine-pseudoephedrine-DM 30-2-10 MG/5ML syrup Take 10 mLs by mouth 4 (four) times daily as needed. Patient not taking: Reported on 05/08/2018 03/02/13   Liam Graham, PA-C  fluticasone [facility]) 50 MCG/ACT nasal spray Place 2 sprays into the nose daily. Patient not taking: Reported on 05/08/2018 03/02/13   Liam Graham, PA-C  methylPREDNISolone (MEDROL DOSEPAK) 4 MG tablet follow package directions Patient not taking: Reported on 05/08/2018 03/02/13   Liam Graham, PA-C  Multiple Vitamin (MULTIVITAMIN WITH MINERALS) TABS tablet Take 1 tablet by mouth daily.    [provider]  oxyCODONE-acetaminophen (PERCOCET) 7.5-325 MG tablet Take 1 tablet by mouth every 4 (four) hours as needed for severe pain. Patient not taking: Reported on 05/08/2018 05/23/15   Sable Feil, PA-C    Family History Family History  Problem Relation Age of Onset  . Healthy Mother   . Healthy Father     Social History Social History   Tobacco Use  . Smoking status: Former Research scientist (life sciences)  . Smokeless tobacco: Never Used  Substance Use Topics  . Alcohol use: No  . Drug use: No     Allergies   Patient has no known allergies.   Review of Systems Review of Systems  Constitutional: Negative for chills and fever.  HENT: Negative for congestion, ear pain, rhinorrhea and sore throat.   Eyes: Negative for pain and visual disturbance.  Respiratory: Negative for cough and shortness of breath.   Cardiovascular: Negative for chest pain and palpitations.  Gastrointestinal: Positive for nausea and vomiting. Negative for abdominal pain, constipation and diarrhea.  Genitourinary: Negative for dysuria and hematuria.  Musculoskeletal: Negative for arthralgias and back pain.  Skin: Negative for color change and rash.  Neurological: Negative for seizures and syncope.  All other systems reviewed and are negative.    Physical Exam Triage Vital Signs ED Triage Vitals  Enc Vitals Group     BP 02/02/19 1628 112/66     Pulse Rate 02/02/19 1628 77     Resp 02/02/19 1628 16  Temp 02/02/19 1628 97.9 F (36.6 C)     Temp Source 02/02/19 1628 Other     SpO2 02/02/19 1628 99 %     Weight --      Height --      Head Circumference --      Peak Flow --      Pain Score 02/02/19 1626 0     Pain Loc --      Pain Edu? --      Excl. in GC? --    No data found.  Updated Vital Signs BP 112/66   Pulse 77   Temp 97.9 F (36.6 C) (Other (Comment))   Resp 16   LMP 12/24/2018 (Exact Date)   SpO2 99%   Visual Acuity Right Eye Distance:   Left Eye Distance:   Bilateral Distance:    Right Eye Near:   Left Eye Near:    Bilateral Near:     Physical Exam Vitals signs and nursing note reviewed.  Constitutional:      General: She is not in acute distress.    Appearance: She is  well-developed. She is not ill-appearing.  HENT:     Head: Normocephalic and atraumatic.     Right Ear: Tympanic membrane normal.     Left Ear: Tympanic membrane normal.     Nose: Nose normal.     Mouth/Throat:     Mouth: Mucous membranes are moist.     Pharynx: Oropharynx is clear.  Eyes:     Conjunctiva/sclera: Conjunctivae normal.  Neck:     Musculoskeletal: Neck supple.  Cardiovascular:     Rate and Rhythm: Normal rate and regular rhythm.     Heart sounds: No murmur.  Pulmonary:     Effort: Pulmonary effort is normal. No respiratory distress.     Breath sounds: Normal breath sounds.  Abdominal:     General: Bowel sounds are normal.     Palpations: Abdomen is soft.     Tenderness: There is no abdominal tenderness.  Skin:    General: Skin is warm and dry.     Findings: No rash.  Neurological:     General: No focal deficit present.     Mental Status: She is alert and oriented to person, place, and time.      UC Treatments / Results  Labs (all labs ordered are listed, but only abnormal results are displayed) Labs Reviewed  POCT PREGNANCY, URINE - Abnormal; Notable for the following components:      Result Value   Preg Test, Ur POSITIVE (*)    All other components within normal limits  NOVEL CORONAVIRUS, NAA (HOSP ORDER, SEND-OUT TO REF LAB; TAT 18-24 HRS)  POC URINE PREG, ED    EKG   Radiology No results found.  Procedures Procedures (including critical care time)  Medications Ordered in UC Medications - No data to display  Initial Impression / Assessment and Plan / UC Course  I have reviewed the triage vital signs and the nursing notes.  Pertinent labs & imaging results that were available during my care of the patient were reviewed by me and considered in my medical decision making (see chart for details).    Patient request for pregnancy test and COVID test.  Urine pregnancy positive.  Instructed patient to start prenatal vitamins and schedule an  appointment with her OB/GYN.  COVID test performed here.  Instructed patient to self quarantine until her test results are back.  Instructed patient to go to  the emergency department if she develops high fever, shortness of breath, severe diarrhea, or other concerning symptoms.  Patient agrees with plan of care.     Final Clinical Impressions(s) / UC Diagnoses   Final diagnoses:  Patient request for diagnostic testing  Less than [redacted] weeks gestation of pregnancy     Discharge Instructions     Your urine pregnancy test is positive.  Start taking prenatal vitamins today.    Your COVID test is pending.  You should self quarantine until your test result is back and is negative.    Go to the emergency department if you develop high fever, shortness of breath, severe diarrhea, or other concerning symptoms.        ED Prescriptions    None     PDMP not reviewed this encounter.   Mickie Bailate, Bodee Lafoe H, NP 02/02/19 [id number]

## 2019-02-03 LAB — NOVEL CORONAVIRUS, NAA (HOSP ORDER, SEND-OUT TO REF LAB; TAT 18-24 HRS): SARS-CoV-2, NAA: NOT DETECTED

## 2019-03-01 LAB — OB RESULTS CONSOLE ABO/RH: RH Type: POSITIVE

## 2019-03-01 LAB — OB RESULTS CONSOLE ANTIBODY SCREEN: Antibody Screen: NEGATIVE

## 2019-03-01 LAB — OB RESULTS CONSOLE RUBELLA ANTIBODY, IGM: Rubella: IMMUNE

## 2019-03-01 LAB — OB RESULTS CONSOLE HEPATITIS B SURFACE ANTIGEN: Hepatitis B Surface Ag: NEGATIVE

## 2019-03-01 LAB — OB RESULTS CONSOLE GC/CHLAMYDIA
Chlamydia: POSITIVE
Gonorrhea: NEGATIVE

## 2019-03-01 LAB — OB RESULTS CONSOLE HIV ANTIBODY (ROUTINE TESTING): HIV: NONREACTIVE

## 2019-03-01 LAB — OB RESULTS CONSOLE RPR: RPR: NONREACTIVE

## 2019-05-27 LAB — OB RESULTS CONSOLE GC/CHLAMYDIA: Chlamydia: NEGATIVE

## 2019-09-06 ENCOUNTER — Encounter (HOSPITAL_COMMUNITY): Payer: Self-pay | Admitting: *Deleted

## 2019-09-06 ENCOUNTER — Telehealth (HOSPITAL_COMMUNITY): Payer: Self-pay | Admitting: *Deleted

## 2019-09-06 LAB — OB RESULTS CONSOLE GBS: GBS: NEGATIVE

## 2019-09-06 NOTE — Telephone Encounter (Signed)
Preadmission screen  

## 2019-09-10 ENCOUNTER — Encounter (HOSPITAL_COMMUNITY): Payer: Self-pay | Admitting: *Deleted

## 2019-09-12 ENCOUNTER — Telehealth (HOSPITAL_COMMUNITY): Payer: Self-pay | Admitting: *Deleted

## 2019-09-12 ENCOUNTER — Other Ambulatory Visit (HOSPITAL_COMMUNITY): Payer: Medicaid Other | Attending: Obstetrics and Gynecology

## 2019-09-12 NOTE — Telephone Encounter (Signed)
Preadmission screen  

## 2019-09-14 ENCOUNTER — Inpatient Hospital Stay (HOSPITAL_COMMUNITY)
Admission: RE | Admit: 2019-09-14 | Payer: Medicaid Other | Source: Ambulatory Visit | Admitting: Obstetrics and Gynecology

## 2019-09-14 ENCOUNTER — Inpatient Hospital Stay (HOSPITAL_COMMUNITY): Admission: AD | Admit: 2019-09-14 | Payer: Self-pay | Source: Home / Self Care | Admitting: Obstetrics and Gynecology

## 2019-09-14 ENCOUNTER — Inpatient Hospital Stay (HOSPITAL_COMMUNITY)
Admission: AD | Admit: 2019-09-14 | Payer: Medicaid Other | Source: Home / Self Care | Admitting: Obstetrics and Gynecology

## 2019-09-21 ENCOUNTER — Other Ambulatory Visit (HOSPITAL_COMMUNITY)
Admission: RE | Admit: 2019-09-21 | Discharge: 2019-09-21 | Disposition: A | Discharge status: Home / Self Care | Payer: Medicaid Other | Source: Ambulatory Visit | Attending: Obstetrics and Gynecology | Admitting: Obstetrics and Gynecology

## 2019-09-21 DIAGNOSIS — Z01812 Encounter for preprocedural laboratory examination: Secondary | ICD-10-CM | POA: Insufficient documentation

## 2019-09-21 DIAGNOSIS — Z20822 Contact with and (suspected) exposure to covid-19: Secondary | ICD-10-CM | POA: Diagnosis not present

## 2019-09-21 LAB — SARS CORONAVIRUS 2 (TAT 6-24 HRS): SARS Coronavirus 2: NEGATIVE

## 2019-09-23 ENCOUNTER — Inpatient Hospital Stay (HOSPITAL_COMMUNITY): Payer: Medicaid Other | Admitting: Anesthesiology

## 2019-09-23 ENCOUNTER — Encounter (HOSPITAL_COMMUNITY)
Admission: AD | Disposition: A | Discharge status: Home / Self Care | Payer: Self-pay | Source: Home / Self Care | Attending: Obstetrics and Gynecology

## 2019-09-23 ENCOUNTER — Inpatient Hospital Stay (HOSPITAL_COMMUNITY): Payer: Medicaid Other

## 2019-09-23 ENCOUNTER — Inpatient Hospital Stay (HOSPITAL_COMMUNITY)
Admission: AD | Admit: 2019-09-23 | Discharge: 2019-09-25 | DRG: 788 | Disposition: A | Discharge status: Home / Self Care | Payer: Medicaid Other | Attending: Obstetrics and Gynecology | Admitting: Obstetrics and Gynecology

## 2019-09-23 ENCOUNTER — Encounter (HOSPITAL_COMMUNITY): Payer: Self-pay | Admitting: Certified Registered Nurse Anesthetist

## 2019-09-23 ENCOUNTER — Encounter (HOSPITAL_COMMUNITY): Payer: Self-pay | Admitting: Obstetrics and Gynecology

## 2019-09-23 ENCOUNTER — Other Ambulatory Visit: Payer: Self-pay

## 2019-09-23 DIAGNOSIS — Z3490 Encounter for supervision of normal pregnancy, unspecified, unspecified trimester: Secondary | ICD-10-CM

## 2019-09-23 DIAGNOSIS — Z3A39 39 weeks gestation of pregnancy: Secondary | ICD-10-CM

## 2019-09-23 DIAGNOSIS — Z3483 Encounter for supervision of other normal pregnancy, third trimester: Secondary | ICD-10-CM | POA: Diagnosis present

## 2019-09-23 HISTORY — DX: Herpesviral infection, unspecified: B00.9

## 2019-09-23 LAB — CBC
HCT: 37.2 % (ref 36.0–46.0)
Hemoglobin: 12.2 g/dL (ref 12.0–15.0)
MCH: 29.1 pg (ref 26.0–34.0)
MCHC: 32.8 g/dL (ref 30.0–36.0)
MCV: 88.8 fL (ref 80.0–100.0)
Platelets: 245 10*3/uL (ref 150–400)
RBC: 4.19 MIL/uL (ref 3.87–5.11)
RDW: 14.9 % (ref 11.5–15.5)
WBC: 6.4 10*3/uL (ref 4.0–10.5)
nRBC: 0 % (ref 0.0–0.2)

## 2019-09-23 LAB — RPR: RPR Ser Ql: NONREACTIVE

## 2019-09-23 LAB — TYPE AND SCREEN
ABO/RH(D): O POS
Antibody Screen: NEGATIVE

## 2019-09-23 LAB — ABO/RH: ABO/RH(D): O POS

## 2019-09-23 SURGERY — Surgical Case
Anesthesia: Epidural

## 2019-09-23 MED ORDER — LIDOCAINE HCL (PF) 1 % IJ SOLN: 30.0000 mL | INTRAMUSCULAR | Status: DC | PRN

## 2019-09-23 MED ORDER — ACETAMINOPHEN 160 MG/5ML PO SOLN: 1000.0000 mg | Freq: Once | ORAL | Status: DC

## 2019-09-23 MED ORDER — ACETAMINOPHEN 325 MG PO TABS: 650.0000 mg | ORAL_TABLET | ORAL | Status: DC | PRN

## 2019-09-23 MED ORDER — SOD CITRATE-CITRIC ACID 500-334 MG/5ML PO SOLN
30.0000 mL | ORAL | Status: DC | PRN
  Administered 2019-09-23: 30 mL via ORAL
  Filled 2019-09-23: qty 30

## 2019-09-23 MED ORDER — NALBUPHINE HCL 10 MG/ML IJ SOLN: 5.0000 mg | INTRAMUSCULAR | Status: DC | PRN

## 2019-09-23 MED ORDER — LACTATED RINGERS IV SOLN: INTRAVENOUS | Status: DC | PRN

## 2019-09-23 MED ORDER — CEFAZOLIN SODIUM-DEXTROSE 2-4 GM/100ML-% IV SOLN
2.0000 g | INTRAVENOUS | Status: AC
  Administered 2019-09-23: 2 g via INTRAVENOUS

## 2019-09-23 MED ORDER — OXYTOCIN 40 UNITS IN NORMAL SALINE INFUSION - SIMPLE MED
INTRAVENOUS | Status: DC | PRN
  Administered 2019-09-23: 40 [IU] via INTRAVENOUS

## 2019-09-23 MED ORDER — ONDANSETRON HCL 4 MG/2ML IJ SOLN: 4.0000 mg | Freq: Four times a day (QID) | INTRAMUSCULAR | Status: DC | PRN

## 2019-09-23 MED ORDER — OXYTOCIN 40 UNITS IN NORMAL SALINE INFUSION - SIMPLE MED
1.0000 m[IU]/min | INTRAVENOUS | Status: DC
  Administered 2019-09-23: 2 m[IU]/min via INTRAVENOUS
  Filled 2019-09-23: qty 1000

## 2019-09-23 MED ORDER — PHENYLEPHRINE 40 MCG/ML (10ML) SYRINGE FOR IV PUSH (FOR BLOOD PRESSURE SUPPORT): 80.0000 ug | PREFILLED_SYRINGE | INTRAVENOUS | Status: DC | PRN

## 2019-09-23 MED ORDER — MISOPROSTOL 25 MCG QUARTER TABLET
25.0000 ug | ORAL_TABLET | Freq: Once | ORAL | Status: AC
  Administered 2019-09-23: 25 ug via VAGINAL
  Filled 2019-09-23: qty 1

## 2019-09-23 MED ORDER — OXYTOCIN 40 UNITS IN NORMAL SALINE INFUSION - SIMPLE MED
INTRAVENOUS | Status: AC
  Filled 2019-09-23: qty 1000

## 2019-09-23 MED ORDER — FLEET ENEMA 7-19 GM/118ML RE ENEM: 1.0000 | ENEMA | RECTAL | Status: DC | PRN

## 2019-09-23 MED ORDER — NALBUPHINE HCL 10 MG/ML IJ SOLN: 5.0000 mg | Freq: Once | INTRAMUSCULAR | Status: DC | PRN

## 2019-09-23 MED ORDER — MISOPROSTOL 25 MCG QUARTER TABLET
25.0000 ug | ORAL_TABLET | ORAL | Status: DC | PRN
  Filled 2019-09-23: qty 1

## 2019-09-23 MED ORDER — SODIUM BICARBONATE 8.4 % IV SOLN
INTRAVENOUS | Status: AC
  Filled 2019-09-23: qty 50

## 2019-09-23 MED ORDER — EPHEDRINE 5 MG/ML INJ: 10.0000 mg | INTRAVENOUS | Status: DC | PRN

## 2019-09-23 MED ORDER — DIPHENHYDRAMINE HCL 50 MG/ML IJ SOLN: 12.5000 mg | INTRAMUSCULAR | Status: DC | PRN

## 2019-09-23 MED ORDER — OXYTOCIN 40 UNITS IN NORMAL SALINE INFUSION - SIMPLE MED: 2.5000 [IU]/h | INTRAVENOUS | Status: DC

## 2019-09-23 MED ORDER — DIPHENHYDRAMINE HCL 25 MG PO CAPS: 25.0000 mg | ORAL_CAPSULE | ORAL | Status: DC | PRN

## 2019-09-23 MED ORDER — MORPHINE SULFATE (PF) 0.5 MG/ML IJ SOLN
INTRAMUSCULAR | Status: DC | PRN
  Administered 2019-09-23: 3 mg via EPIDURAL

## 2019-09-23 MED ORDER — KETOROLAC TROMETHAMINE 30 MG/ML IJ SOLN: 30.0000 mg | Freq: Four times a day (QID) | INTRAMUSCULAR | Status: AC | PRN

## 2019-09-23 MED ORDER — ALBUMIN HUMAN 5 % IV SOLN: INTRAVENOUS | Status: DC | PRN

## 2019-09-23 MED ORDER — SODIUM CHLORIDE 0.9 % IR SOLN
Status: DC | PRN
  Administered 2019-09-23 (×2): 1

## 2019-09-23 MED ORDER — SOD CITRATE-CITRIC ACID 500-334 MG/5ML PO SOLN: 30.0000 mL | ORAL | Status: DC

## 2019-09-23 MED ORDER — ONDANSETRON HCL 4 MG/2ML IJ SOLN: 4.0000 mg | Freq: Three times a day (TID) | INTRAMUSCULAR | Status: DC | PRN

## 2019-09-23 MED ORDER — ACETAMINOPHEN 500 MG PO TABS: 1000.0000 mg | ORAL_TABLET | Freq: Once | ORAL | Status: DC

## 2019-09-23 MED ORDER — LACTATED RINGERS IV SOLN
500.0000 mL | INTRAVENOUS | Status: DC | PRN
  Administered 2019-09-23: 500 mL via INTRAVENOUS

## 2019-09-23 MED ORDER — OXYCODONE-ACETAMINOPHEN 5-325 MG PO TABS: 2.0000 | ORAL_TABLET | ORAL | Status: DC | PRN

## 2019-09-23 MED ORDER — OXYCODONE-ACETAMINOPHEN 5-325 MG PO TABS: 1.0000 | ORAL_TABLET | ORAL | Status: DC | PRN

## 2019-09-23 MED ORDER — LIDOCAINE HCL (PF) 1 % IJ SOLN
INTRAMUSCULAR | Status: DC | PRN
  Administered 2019-09-23: 10 mL via EPIDURAL

## 2019-09-23 MED ORDER — FENTANYL CITRATE (PF) 100 MCG/2ML IJ SOLN: 25.0000 ug | INTRAMUSCULAR | Status: DC | PRN

## 2019-09-23 MED ORDER — TERBUTALINE SULFATE 1 MG/ML IJ SOLN: 0.2500 mg | Freq: Once | INTRAMUSCULAR | Status: DC | PRN

## 2019-09-23 MED ORDER — ONDANSETRON HCL 4 MG/2ML IJ SOLN
INTRAMUSCULAR | Status: AC
  Filled 2019-09-23: qty 2

## 2019-09-23 MED ORDER — PROMETHAZINE HCL 25 MG/ML IJ SOLN: 6.2500 mg | INTRAMUSCULAR | Status: DC | PRN

## 2019-09-23 MED ORDER — NALOXONE HCL 4 MG/10ML IJ SOLN
1.0000 ug/kg/h | INTRAVENOUS | Status: DC | PRN
  Filled 2019-09-23: qty 5

## 2019-09-23 MED ORDER — ACETAMINOPHEN 500 MG PO TABS
1000.0000 mg | ORAL_TABLET | Freq: Four times a day (QID) | ORAL | Status: AC
  Administered 2019-09-24 (×4): 1000 mg via ORAL
  Filled 2019-09-23 (×4): qty 2

## 2019-09-23 MED ORDER — LIDOCAINE-EPINEPHRINE (PF) 2 %-1:200000 IJ SOLN
INTRAMUSCULAR | Status: DC | PRN
  Administered 2019-09-23 (×2): 5 mL via EPIDURAL

## 2019-09-23 MED ORDER — MISOPROSTOL 50MCG HALF TABLET: 50.0000 ug | ORAL_TABLET | Freq: Once | ORAL | Status: AC

## 2019-09-23 MED ORDER — SODIUM CHLORIDE (PF) 0.9 % IJ SOLN
INTRAMUSCULAR | Status: DC | PRN
  Administered 2019-09-23: 12 mL/h via EPIDURAL

## 2019-09-23 MED ORDER — SODIUM CHLORIDE 0.9% FLUSH: 3.0000 mL | INTRAVENOUS | Status: DC | PRN

## 2019-09-23 MED ORDER — NALOXONE HCL 0.4 MG/ML IJ SOLN: 0.4000 mg | INTRAMUSCULAR | Status: DC | PRN

## 2019-09-23 MED ORDER — ONDANSETRON HCL 4 MG/2ML IJ SOLN
INTRAMUSCULAR | Status: DC | PRN
  Administered 2019-09-23: 4 mg via INTRAVENOUS

## 2019-09-23 MED ORDER — KETOROLAC TROMETHAMINE 30 MG/ML IJ SOLN
INTRAMUSCULAR | Status: AC
  Filled 2019-09-23: qty 1

## 2019-09-23 MED ORDER — OXYTOCIN BOLUS FROM INFUSION: 500.0000 mL | Freq: Once | INTRAVENOUS | Status: DC

## 2019-09-23 MED ORDER — SODIUM CHLORIDE 0.9 % IV SOLN: INTRAVENOUS | Status: DC | PRN

## 2019-09-23 MED ORDER — KETOROLAC TROMETHAMINE 30 MG/ML IJ SOLN
30.0000 mg | Freq: Four times a day (QID) | INTRAMUSCULAR | Status: AC | PRN
  Administered 2019-09-24: 30 mg via INTRAVENOUS

## 2019-09-23 MED ORDER — LACTATED RINGERS IV SOLN: INTRAVENOUS | Status: DC

## 2019-09-23 MED ORDER — TRANEXAMIC ACID-NACL 1000-0.7 MG/100ML-% IV SOLN
INTRAVENOUS | Status: DC | PRN
Start: 2019-09-23 — End: 2019-09-23
  Administered 2019-09-23: 1000 mg via INTRAVENOUS

## 2019-09-23 MED ORDER — FENTANYL CITRATE (PF) 100 MCG/2ML IJ SOLN
INTRAMUSCULAR | Status: DC | PRN
  Administered 2019-09-23: 100 ug via INTRAVENOUS

## 2019-09-23 MED ORDER — FENTANYL CITRATE (PF) 100 MCG/2ML IJ SOLN
INTRAMUSCULAR | Status: AC
  Filled 2019-09-23: qty 2

## 2019-09-23 MED ORDER — LACTATED RINGERS IV SOLN: 500.0000 mL | Freq: Once | INTRAVENOUS | Status: DC

## 2019-09-23 MED ORDER — MORPHINE SULFATE (PF) 0.5 MG/ML IJ SOLN
INTRAMUSCULAR | Status: AC
  Filled 2019-09-23: qty 10

## 2019-09-23 MED ORDER — TRANEXAMIC ACID-NACL 1000-0.7 MG/100ML-% IV SOLN
INTRAVENOUS | Status: AC
  Filled 2019-09-23: qty 100

## 2019-09-23 MED ORDER — BUTORPHANOL TARTRATE 1 MG/ML IJ SOLN
1.0000 mg | INTRAMUSCULAR | Status: DC | PRN
  Administered 2019-09-23 (×2): 1 mg via INTRAVENOUS
  Filled 2019-09-23 (×2): qty 1

## 2019-09-23 MED ORDER — MISOPROSTOL 50MCG HALF TABLET
ORAL_TABLET | ORAL | Status: AC
  Administered 2019-09-23: 50 ug via ORAL
  Filled 2019-09-23: qty 1

## 2019-09-23 MED ORDER — LIDOCAINE-EPINEPHRINE (PF) 2 %-1:200000 IJ SOLN
INTRAMUSCULAR | Status: AC
  Filled 2019-09-23: qty 10

## 2019-09-23 MED ORDER — FENTANYL-BUPIVACAINE-NACL 0.5-0.125-0.9 MG/250ML-% EP SOLN
12.0000 mL/h | EPIDURAL | Status: DC | PRN
  Filled 2019-09-23 (×2): qty 250

## 2019-09-23 MED ORDER — ALBUMIN HUMAN 5 % IV SOLN
INTRAVENOUS | Status: AC
  Filled 2019-09-23: qty 250

## 2019-09-23 MED ORDER — KETOROLAC TROMETHAMINE 30 MG/ML IJ SOLN
30.0000 mg | Freq: Once | INTRAMUSCULAR | Status: AC
  Administered 2019-09-23: 30 mg via INTRAVENOUS

## 2019-09-23 SURGICAL SUPPLY — 43 items
BENZOIN TINCTURE PRP APPL 2/3 (GAUZE/BANDAGES/DRESSINGS) ×2 IMPLANT
CHLORAPREP W/TINT 26ML (MISCELLANEOUS) ×2 IMPLANT
CLAMP CORD UMBIL (MISCELLANEOUS) IMPLANT
CLOTH BEACON ORANGE TIMEOUT ST (SAFETY) ×2 IMPLANT
DERMABOND ADVANCED (GAUZE/BANDAGES/DRESSINGS)
DERMABOND ADVANCED .7 DNX12 (GAUZE/BANDAGES/DRESSINGS) IMPLANT
DRSG OPSITE POSTOP 4X10 (GAUZE/BANDAGES/DRESSINGS) ×2 IMPLANT
ELECT REM PT RETURN 9FT ADLT (ELECTROSURGICAL) ×2
ELECTRODE REM PT RTRN 9FT ADLT (ELECTROSURGICAL) ×1 IMPLANT
EXTRACTOR VACUUM BELL STYLE (SUCTIONS) IMPLANT
GAUZE SPONGE 4X4 12PLY STRL LF (GAUZE/BANDAGES/DRESSINGS) IMPLANT
GLOVE BIO SURGEON STRL SZ 6 (GLOVE) ×2 IMPLANT
GLOVE BIOGEL PI IND STRL 6.5 (GLOVE) ×1 IMPLANT
GLOVE BIOGEL PI IND STRL 7.0 (GLOVE) ×2 IMPLANT
GLOVE BIOGEL PI INDICATOR 6.5 (GLOVE) ×1
GLOVE BIOGEL PI INDICATOR 7.0 (GLOVE) ×2
GOWN STRL REUS W/TWL LRG LVL3 (GOWN DISPOSABLE) ×4 IMPLANT
HEMOSTAT ARISTA ABSORB 3G PWDR (HEMOSTASIS) ×2 IMPLANT
KIT ABG SYR 3ML LUER SLIP (SYRINGE) IMPLANT
NEEDLE HYPO 25X5/8 SAFETYGLIDE (NEEDLE) IMPLANT
NS IRRIG 1000ML POUR BTL (IV SOLUTION) ×2 IMPLANT
PACK C SECTION WH (CUSTOM PROCEDURE TRAY) ×2 IMPLANT
PAD ABD 7.5X8 STRL (GAUZE/BANDAGES/DRESSINGS) ×2 IMPLANT
PAD ABD 8X10 STRL (GAUZE/BANDAGES/DRESSINGS) IMPLANT
PAD OB MATERNITY 4.3X12.25 (PERSONAL CARE ITEMS) ×2 IMPLANT
PENCIL SMOKE EVAC W/HOLSTER (ELECTROSURGICAL) ×2 IMPLANT
RTRCTR C-SECT PINK 25CM LRG (MISCELLANEOUS) IMPLANT
SPONGE GAUZE 4X4 12PLY STER LF (GAUZE/BANDAGES/DRESSINGS) ×4 IMPLANT
STRIP CLOSURE SKIN 1/2X4 (GAUZE/BANDAGES/DRESSINGS) ×2 IMPLANT
SUT PDS AB 0 CTX 36 PDP370T (SUTURE) IMPLANT
SUT PLAIN 2 0 (SUTURE) ×1
SUT PLAIN ABS 2-0 CT1 27XMFL (SUTURE) ×1 IMPLANT
SUT VIC AB 0 CT1 27 (SUTURE) ×1
SUT VIC AB 0 CT1 27XBRD ANBCTR (SUTURE) ×1 IMPLANT
SUT VIC AB 0 CT1 36 (SUTURE) ×4 IMPLANT
SUT VIC AB 2-0 CT1 27 (SUTURE) ×1
SUT VIC AB 2-0 CT1 TAPERPNT 27 (SUTURE) ×1 IMPLANT
SUT VIC AB 3-0 SH 27 (SUTURE)
SUT VIC AB 3-0 SH 27X BRD (SUTURE) IMPLANT
SUT VIC AB 4-0 KS 27 (SUTURE) ×2 IMPLANT
TOWEL OR 17X24 6PK STRL BLUE (TOWEL DISPOSABLE) ×2 IMPLANT
TRAY FOLEY W/BAG SLVR 14FR LF (SET/KITS/TRAYS/PACK) ×2 IMPLANT
WATER STERILE IRR 1000ML POUR (IV SOLUTION) ×2 IMPLANT

## 2019-09-23 NOTE — Progress Notes (Signed)
Pt calling with increased pressure, recurrent early decels, occ variable. CE now 7/90/0. Making quick change s/p AROM, close eye on status, anticipate SVD

## 2019-09-23 NOTE — H&P (Signed)
Samantha Mcintosh is a 29 y.o. female presenting for scheduled IOL. +FM, denies VB< thinks mucus plug came out recently.  PNC c/b + chlamydia w. 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 Depression    PP not treated resolved after 3-4 mos   Past Surgical History:  Procedure Laterality Date  . INDUCED ABORTION    . WISDOM TOOTH EXTRACTION     Family History: family history includes Healthy in her father and mother. Social History:  reports that she has never smoked. She has never used smokeless tobacco. She reports previous alcohol use. She reports that she does not use drugs.     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  Respiratory: Negative for shortness of breath.   Cardiovascular: Negative for chest pain, palpitations and leg swelling.  Gastrointestinal: Negative for abdominal pain and vomiting.  Neurological: Negative for dizziness, weakness and headaches.  Psychiatric/Behavioral: Negative for suicidal ideas.   Maternal Medical History:  Fetal activity: Perceived fetal activity is normal.    Prenatal complications: No  bleeding, PIH, pre-eclampsia, preterm labor or thrombocytopenia.   Prenatal Complications - Diabetes: none.    Dilation: 1 Effacement (%): 70 Station: -3 Exam by:: Reino Bellis, RN Blood pressure 118/62, pulse 85, temperature 98.7 F (37.1 C), temperature source Oral, resp. rate 18, height 5\' 5"  (1.651 m), weight 103.1 kg, last menstrual period 12/24/2018. Exam Physical Exam  Constitutional: She is oriented to person, place, and time. She appears well-developed and well-nourished. No distress.  HENT:  Head: Normocephalic and atraumatic.  Eyes: Pupils are equal, round, and reactive to light.  Cardiovascular: Normal rate and regular rhythm. Exam reveals no gallop.  No murmur heard. GI: There is no abdominal tenderness. There is no rebound and no guarding.  Genitourinary:    Vagina and uterus normal.   Musculoskeletal:        General: Normal range of motion.     Cervical back: Normal range of motion and neck supple.  Neurological: She is alert and oriented to person, place, and time.  Skin: Skin is warm and dry.    Prenatal labs: ABO, Rh: --/--/O POS, O POS Performed at [facility], [place] [address], [place], [place] [address]  [phone]) Antibody: NEG (05/17 0201) Rubella: Immune (10/23 0000) RPR: Nonreactive (10/23 0000)  HBsAg: Negative (10/23 0000)  HIV: Non-reactive (10/23 0000)  GBS: Negative/-- (04/30 0000)   BSUS confirms vtx  presentation, difficult check 2/2 pt discomfort  Cat 1 tracing, TOCO q3-4  Assessment/Plan: This is a 29yo G3P1011 @ 39 0/7 presenting for elective IOL. PNC c/b int FrX carrier, HSV/chalmydia, +sickel cell trait. GBS neg, baby boy, desires circ, efw 3450g  Pt s/p 1 dose PO cytotec. CE unchange,d repeat dose PV. IV pain meds PRN, may have epidural when allowable. CLD, CEFM.    Valerie Roys Lui Bellis 09/23/2019, 7:50 AM

## 2019-09-23 NOTE — Transfer of Care (Signed)
Immediate Anesthesia Transfer of Care Note  Patient: Samantha Mcintosh  Procedure(s) Performed: CESAREAN SECTION (N/A )  Patient Location: PACU  Anesthesia Type:Epidural  Level of Consciousness: awake  Airway & Oxygen Therapy: Patient Spontanous Breathing  Post-op Assessment: Report given to RN and Post -op Vital signs reviewed and stable  Post vital signs: Reviewed and stable  Last Vitals:  Vitals Value Taken Time  BP 130/68 09/23/19 2224  Temp    Pulse 93 09/23/19 2225  Resp 24 09/23/19 2225  SpO2 100 % 09/23/19 2225  Vitals shown include unvalidated device data.  Last Pain:  Vitals:   09/23/19 1924  TempSrc:   PainSc: 5          Complications: No apparent anesthesia complications

## 2019-09-23 NOTE — Progress Notes (Signed)
Patient with with recurrent decels resolved with turning off pitocin. Still contracting q50m and found to be complete/0. Began active management of second stage at this time. After approx 1hr of pushing with excellent effort, no descent of fetal occiput, significant caput forming.   Patient reveals at this time that she had a VAVD in G1 not SVD 2/2 "baby getting stuck." That baby 7lb10oz. Reviewed with patient option of continued attempts at vaginal birth with possible operative delivery vs PLTCS. Risks and benefits discussed in detail.  Risks include, but are not limited to the risks of anesthesia, bleeding, infection, damage to maternal tissues, fetal cephalhematoma.  There is also the risk of inability to effect vaginal delivery of the head, or shoulder dystocia that cannot be resolved by established maneuvers, leading to the need for emergency cesarean section. Patient is declining vacuum at this time and elects to move forward with PLTCS  R/B/A of cesarean section discussed with patient. Alternative would be vaginal delivery which would mean shorter postpartum stay and decreased risk of bleeding. Risks of section include infection of the uterus, pelvic organs, or skin, inadvertent injury to internal organs, such as bowel or bladder. If there is major injury, extensive surgery may be required. If injury is minor, it may be treated with relative ease. Discussed possibility of excessive blood loss and transfusion. If bleeding cannot be controlled using medical or minor surgical methods, a cesarean hysterectomy may be performed which would mean no future fertility. Patient accepts the possibility of blood transfusion, if necessary. Patient understands and agrees to move forward with section.

## 2019-09-23 NOTE — Progress Notes (Signed)
Labor Note  S: Comfortable s/p epidraul  O: BP 119/82   Pulse 61   Temp 99.1 F (37.3 C) (Oral)   Resp 18   Ht 5\' 5"  (1.651 m)   Wt 103.1 kg   LMP 12/24/2018   SpO2 100%   BMI 37.81 kg/m  CE: AROM w/ mod mec, Ce 3/80/-2, very soft and anterior FHR: Baseline 135, +accels, -decels, mod variability TOCO 2-3, pitocin at 30mU/min  A/P: This is a 29 y.o. G3P1011 at [redacted]w[redacted]d  admitted for IOL.  FWB: Cat 1 [redacted]w[redacted]d s/p epidural Labor course: Mec AROM @ 1736, latent labor, continue to titrate pitocin  Anticipate SVD

## 2019-09-23 NOTE — Anesthesia Procedure Notes (Signed)
Epidural Patient location during procedure: OB Start time: 09/23/2019 4:01 PM End time: 09/23/2019 4:11 PM  Staffing Anesthesiologist: Lucretia Kern, MD Performed: anesthesiologist   Preanesthetic Checklist Completed: patient identified, IV checked, risks and benefits discussed, monitors and equipment checked, pre-op evaluation and timeout performed  Epidural Patient position: sitting Prep: DuraPrep Patient monitoring: heart rate, continuous pulse ox and blood pressure Approach: midline Location: L3-L4 Injection technique: LOR air  Needle:  Needle type: Tuohy  Needle gauge: 17 G Needle length: 9 cm Needle insertion depth: 7 cm Catheter type: closed end flexible Catheter size: 19 Gauge Catheter at skin depth: 12 cm Test dose: negative  Assessment Events: blood not aspirated, injection not painful, no injection resistance, no paresthesia and negative IV test  Additional Notes Reason for block:procedure for pain

## 2019-09-23 NOTE — Op Note (Signed)
C-Section Operative Note  Date: 09/23/19  Preoperative Diagnosis: IUP @ 39 0/7 Postoperative Diagnosis: same as above Indications: Arrest of Descent Assist: Dr Atlee Abide Procedure: Vacuum-Assisted  Primary low transverse cesarean section Complications: Extension of left apex of hysterotomy Findings: Viable female infant with APGARS of 7 and 8 at 1 and 5 minutes, respectively. Normal appearing uterus, bilateral fallopian tubes and ovaries. Specimens: Placenta EBL 950cc IVF 2300LR, 250 albumin UOP 100cc  Patient Course: Admitted for scheduled elective IOL at [redacted]wks EGA. Pushed well for 1 hour with no descent of occiput and significant caput noted. H/o VAVD revealed in this admission for G1. PLTCs called for arrest of descent after discussion options of operative vaginal deliveyr vs primary section.   Consent:  R/B/A of cesarean section discussed with patient. Alternative would be vaginal delivery which would mean shorter postpartum stay and decreased risk of bleeding. Risks of section include infection of the uterus, pelvic organs, or skin, inadvertent injury to internal organs, such as bowel or bladder. If there is major injury, extensive surgery may be required. If injury is minor, it may be treated with relative ease. Discussed possibility of excessive blood loss and transfusion. If bleeding cannot be controlled using medical or minor surgical methods, a cesarean hysterectomy may be performed which would mean no future fertility. Patient accepts the possibility of blood transfusion, if necessary. Patient understands and agrees to move forward with section.   Operative Procedure: Patient was taken to the operating room where epidural anesthesia was found to be adequate by Allis clamp test. She was prepped and draped in the normal sterile fashion in the dorsal supine position with a leftward tilt. An appropriate time out was performed. A Pfannenstiel skin incision was then made with the  scalpel and carried through to the underlying layer of fascia by sharp dissection and Bovie cautery. The fascia was nicked in the midline and the incision was extended laterally with Mayo scissors/bluntly. The superior aspect of the incision was grasped Coker clamps and dissected off the underlying rectus muscles. In a similar fashion the inferior aspect was dissected off the rectus muscles. Rectus muscles were separated in the midline and the peritoneal cavity entered bluntly. The peritoneal incision was then extended both superiorly and inferiorly with careful attention to avoid both bowel and bladder. The Alexis self-retaining wound retractor was then placed within the incision and the lower uterine segment exposed. The bladder flap was developed with Metzenbaum scissors and pushed away from the lower uterine segment. The lower uterine segment was then incised in a low transverse fashion and the cavity itself entered bluntly. The incision was extended bluntly. Amniotic sac was ruptured and fluid was noted to be meconium-stained in color. Assistant ordered to go below drape and use gloved hand for elevation of fetal occiput (significant caput, noted to be still at 0 station). The infant's head was then lifted and delivered from the incision. Alexis retractor removed, attempted to deliver head using Kiwi but skin dystocia noted. Skin and fascial incisions extending as larger than anticipated nature of fetal occiput appreciated. Rectus bellies transected 1cm. At this time, Kiwi placed appropriately once more on occiput. The remainder of the infant delivered with appropriate fundal pressure and Kiwi pull and the nose and mouth bulb suctioned with the cord clamped and cut as well. The infant was handed off to NICU. The placenta was then spontaneously expressed from the uterus and the uterus cleared of all clots and debris with moist lap sponge. 2cm extension on  left apex noted. Dr Vergie Living of faculty presented to aid  in visualization for repair which was achieved with figure-of-eight sutures of 0-vicryl. The uterine incision was then repaired in 2 layers the first layer was a running locked layer of 0-vicryl and the second an imbricating layer of the same suture. The tubes and ovaries were inspected and the gutters cleared of all clots and debris. The uterine incision was inspected and found to be hemostatic. Arista placed over incision. All instruments and sponges as well as the Alexis retractor were then removed from the abdomen. The peritoneum was then reapproximated using 2-0 vicryl suture. The fascia was then closed with 0 Vicryl in a running fashion. Subcutaneous tissue was reapproximated with 3-0 plain in a running fashion. The skin was closed with a subcuticular stitch of 4-0 Vicryl on a Keith needle and then reinforced with Benzoin/steri strips plus Pressure dressing. At the conclusion of the procedure all instruments and sponge counts were correct. Patient was taken to the recovery room in good condition with her baby accompanying her skin to skin.

## 2019-09-23 NOTE — Anesthesia Preprocedure Evaluation (Addendum)
Anesthesia Evaluation  Patient identified by MRN, date of birth, ID band Patient awake    Reviewed: Allergy & Precautions, H&P , NPO status , Patient's Chart, lab work & pertinent test results  History of Anesthesia Complications Negative for: history of anesthetic complications  Airway Mallampati: II  TM Distance: >3 FB Neck ROM: full    Dental  (+) Chipped,    Pulmonary neg pulmonary ROS,    Pulmonary exam normal        Cardiovascular negative cardio ROS Normal cardiovascular exam Rhythm:regular Rate:Normal     Neuro/Psych negative neurological ROS  negative psych ROS   GI/Hepatic negative GI ROS, Neg liver ROS,   Endo/Other  negative endocrine ROS  Renal/GU negative Renal ROS  negative genitourinary   Musculoskeletal   Abdominal   Peds  Hematology negative hematology ROS (+)   Anesthesia Other Findings   Reproductive/Obstetrics (+) Pregnancy                            Anesthesia Physical Anesthesia Plan  ASA: II and emergent  Anesthesia Plan: Epidural   Post-op Pain Management:    Induction:   PONV Risk Score and Plan: 4 or greater and Treatment may vary due to age or medical condition, Ondansetron and Dexamethasone  Airway Management Planned: Natural Airway  Additional Equipment:   Intra-op Plan:   Post-operative Plan:   Informed Consent: I have reviewed the patients History and Physical, chart, labs and discussed the procedure including the risks, benefits and alternatives for the proposed anesthesia with the patient or authorized representative who has indicated his/her understanding and acceptance.       Plan Discussed with: CRNA  Anesthesia Plan Comments: (Epidural to be used for urgent C/S (failure to descend). Stephannie Peters, MD)       Anesthesia Quick Evaluation

## 2019-09-23 NOTE — Anesthesia Postprocedure Evaluation (Signed)
Anesthesia Post Note  Patient: Samantha Mcintosh  Procedure(s) Performed: CESAREAN SECTION (N/A )     Patient location during evaluation: PACU Anesthesia Type: Epidural Level of consciousness: awake and alert and oriented Pain management: pain level controlled Vital Signs Assessment: post-procedure vital signs reviewed and stable Respiratory status: spontaneous breathing, nonlabored ventilation and respiratory function stable Cardiovascular status: blood pressure returned to baseline Postop Assessment: epidural receding, no apparent nausea or vomiting, no headache and no backache Anesthetic complications: no    Last Vitals:  Vitals:   09/23/19 2308 09/23/19 2315  BP:  (!) 118/55  Pulse: 78 91  Resp: 17 (!) 24  Temp:    SpO2: 100% 100%    Last Pain:  Vitals:   09/23/19 2315  TempSrc:   PainSc: 0-No pain   Pain Goal:                Epidural/Spinal Function Cutaneous sensation: Able to Wiggle Toes (09/23/19 2315), Patient able to flex knees: Yes (09/23/19 2315), Patient able to lift hips off bed: No (09/23/19 2315), Back pain beyond tenderness at insertion site: No (09/23/19 2315), Progressively worsening motor and/or sensory loss: No (09/23/19 2315), Bowel and/or bladder incontinence post epidural: No (09/23/19 2315)  Kaylyn Layer

## 2019-09-24 ENCOUNTER — Encounter (HOSPITAL_COMMUNITY): Payer: Self-pay | Admitting: Obstetrics and Gynecology

## 2019-09-24 LAB — CBC
HCT: 30.1 % — ABNORMAL LOW (ref 36.0–46.0)
Hemoglobin: 9.6 g/dL — ABNORMAL LOW (ref 12.0–15.0)
MCH: 29.1 pg (ref 26.0–34.0)
MCHC: 31.9 g/dL (ref 30.0–36.0)
MCV: 91.2 fL (ref 80.0–100.0)
Platelets: 206 10*3/uL (ref 150–400)
RBC: 3.3 MIL/uL — ABNORMAL LOW (ref 3.87–5.11)
RDW: 15.2 % (ref 11.5–15.5)
WBC: 8.7 10*3/uL (ref 4.0–10.5)
nRBC: 0 % (ref 0.0–0.2)

## 2019-09-24 MED ORDER — OXYTOCIN 40 UNITS IN NORMAL SALINE INFUSION - SIMPLE MED
2.5000 [IU]/h | INTRAVENOUS | Status: AC
  Administered 2019-09-24: 2.5 [IU]/h via INTRAVENOUS

## 2019-09-24 MED ORDER — PRENATAL MULTIVITAMIN CH
1.0000 | ORAL_TABLET | Freq: Every day | ORAL | Status: DC
  Administered 2019-09-24 – 2019-09-25 (×2): 1 via ORAL
  Filled 2019-09-24 (×2): qty 1

## 2019-09-24 MED ORDER — KETOROLAC TROMETHAMINE 30 MG/ML IJ SOLN
30.0000 mg | Freq: Four times a day (QID) | INTRAMUSCULAR | Status: AC
  Administered 2019-09-24 (×2): 30 mg via INTRAVENOUS
  Filled 2019-09-24 (×3): qty 1

## 2019-09-24 MED ORDER — OXYCODONE HCL 5 MG PO TABS: 5.0000 mg | ORAL_TABLET | ORAL | Status: DC | PRN

## 2019-09-24 MED ORDER — SENNOSIDES-DOCUSATE SODIUM 8.6-50 MG PO TABS
2.0000 | ORAL_TABLET | ORAL | Status: DC
  Administered 2019-09-24: 2 via ORAL
  Filled 2019-09-24: qty 2

## 2019-09-24 MED ORDER — ZOLPIDEM TARTRATE 5 MG PO TABS: 5.0000 mg | ORAL_TABLET | Freq: Every evening | ORAL | Status: DC | PRN

## 2019-09-24 MED ORDER — DIBUCAINE (PERIANAL) 1 % EX OINT: 1.0000 "application " | TOPICAL_OINTMENT | CUTANEOUS | Status: DC | PRN

## 2019-09-24 MED ORDER — SIMETHICONE 80 MG PO CHEW
80.0000 mg | CHEWABLE_TABLET | Freq: Three times a day (TID) | ORAL | Status: DC
  Administered 2019-09-24 – 2019-09-25 (×4): 80 mg via ORAL
  Filled 2019-09-24 (×4): qty 1

## 2019-09-24 MED ORDER — COCONUT OIL OIL: 1.0000 "application " | TOPICAL_OIL | Status: DC | PRN

## 2019-09-24 MED ORDER — WITCH HAZEL-GLYCERIN EX PADS: 1.0000 "application " | MEDICATED_PAD | CUTANEOUS | Status: DC | PRN

## 2019-09-24 MED ORDER — LACTATED RINGERS IV SOLN: INTRAVENOUS | Status: DC

## 2019-09-24 MED ORDER — TETANUS-DIPHTH-ACELL PERTUSSIS 5-2.5-18.5 LF-MCG/0.5 IM SUSP
0.5000 mL | Freq: Once | INTRAMUSCULAR | Status: AC
  Administered 2019-09-25: 0.5 mL via INTRAMUSCULAR
  Filled 2019-09-24: qty 0.5

## 2019-09-24 MED ORDER — SIMETHICONE 80 MG PO CHEW: 80.0000 mg | CHEWABLE_TABLET | ORAL | Status: DC | PRN

## 2019-09-24 MED ORDER — SIMETHICONE 80 MG PO CHEW
80.0000 mg | CHEWABLE_TABLET | ORAL | Status: DC
  Administered 2019-09-24 (×2): 80 mg via ORAL
  Filled 2019-09-24 (×2): qty 1

## 2019-09-24 MED ORDER — MENTHOL 3 MG MT LOZG: 1.0000 | LOZENGE | OROMUCOSAL | Status: DC | PRN

## 2019-09-24 MED ORDER — IBUPROFEN 800 MG PO TABS
800.0000 mg | ORAL_TABLET | Freq: Four times a day (QID) | ORAL | Status: DC
  Administered 2019-09-24 – 2019-09-25 (×3): 800 mg via ORAL
  Filled 2019-09-24 (×3): qty 1

## 2019-09-24 MED ORDER — DIPHENHYDRAMINE HCL 25 MG PO CAPS: 25.0000 mg | ORAL_CAPSULE | Freq: Four times a day (QID) | ORAL | Status: DC | PRN

## 2019-09-24 NOTE — Lactation Note (Signed)
This note was copied from a baby's chart. Lactation Consultation Note  Patient Name: Samantha Mcintosh Date: 09/24/2019 Reason for consult: Initial assessment;Term Mom states she breastfed her first baby for 2 weeks.  Newborn is 42 hours old and latching easily to breast.  Mom states she has been instructed on hand expression.  Baby is currently getting a bath.  Instructed to feed with any feeding cue and call for assist prn.  Breastfeeding consultation services information given and reviewed.  Maternal Data Has patient been taught Hand Expression?: Yes Does the patient have breastfeeding experience prior to this delivery?: Yes  Feeding    LATCH Score                   Interventions    Lactation Tools Discussed/Used     Consult Status Consult Status: Follow-up Date: 09/25/19 Follow-up type: In-patient    Huston Foley 09/24/2019, 10:59 AM

## 2019-09-24 NOTE — Clinical Social Work Maternal (Signed)
CLINICAL SOCIAL WORK MATERNAL/CHILD NOTE  Patient Details  Name: Samantha Mcintosh MRN: [id number] Date of Birth: March 21, 1991  Date:  09/24/2019  Clinical Social Worker Initiating Note:  Durward Fortes, LCSW Date/Time: Initiated:  09/24/19/1045     Child's Name:  Roselind Rily   Biological Parents:  Mother, Father(Camia Odilia, Damico Goins)   Need for Interpreter:  None   Reason for Referral:  Behavioral Health Concerns, Current Substance Use/Substance Use During Pregnancy    Address:  [place] [address]    Phone number:  [phone] (home)     Additional phone number: none   Household Members/Support Persons (HM/SP):   Household Member/Support Person 1   HM/SP Name Relationship DOB or Age  HM/SP -[place] MOB     HM/SP -2   Laveda Norman  son   19 years old   HM/SP -3        HM/SP -4        HM/SP -5        HM/SP -6        HM/SP -7        HM/SP -8          Natural Supports (not living in the home):  Parent, Friends, Extended Family   Professional Supports: None   Employment: Unemployed   Type of Work: none   Education:  Other (comment)(obtained GED.)   Homebound arranged:  n/a  Museum/gallery curator Resources:  Medicaid   Other Resources:  [facility]   Cultural/Religious Considerations Which May Impact Care:  none reported.   Strengths:  Ability to meet basic needs , Compliance with medical plan , Home prepared for child , Pediatrician chosen   Psychotropic Medications:     None reported    Pediatrician:    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 CSW went to speak with MOB at bedside to address further needs.   CSW congratulated MOB and FOB on the birth of infant. CSW advised MOB of the HIPPA policy and asked that FOB leave the room. MOB agreeable and so was FOB. CSW advised MOB of CSW's role and the reason for CSW coming to visit with her. MOB reported that before she confirmed that she was pregnant, she did use THC. MOB reported that she recalls her last use being around August 2020 with no use since then. MOB reported no desire to began use again as MOB reported "im planning to breastfeed". CSW understanding of this and advised MOB of the hospital drug screen policy. MOB was advised that infants UDS is negative however CSW would monitor infants CDS and make report to CPS if needed. MOB reported that she had no other substance use during pregnancy and reported that she understood.   CSW inquired from [facility] on her mental health. MOB reported that she was never diagnosed with PPD but felt that she had this as "I was always crying and really sad". CSW was advised that this  lasted for 3-4 months for MOB. MOB reported that she has since been feeling fine and denied any other mental health hx and diagnosis. MOB reported that she isn't feeling SI or HI and denies DV at this time. MOB reported that she has support from FOB and his family as well as her family. MOB reports that she gets [facility] and plans to call and get appointment today. MOB expressed having all other items needed to care for infant.   CSW took time to provide MOB with PPD and SIDS education. MOB was given PPD Checklist in order to keep track of her feelings as they relate to PPD. MOB thanked CSW and reported that she doesn't feel that she needs anything else at this time.   CSW will continue to monitor infants CDS and make CPS report if warranted.   CSW Plan/Description:  No Further Intervention Required/No Barriers to Discharge, Sudden  Infant Death Syndrome (SIDS) Education, Perinatal Mood and Anxiety Disorder (PMADs) Education, Hospital Drug Screen Policy Information, CSW Will Continue to Monitor Umbilical Cord Tissue Drug Screen Results and Make Report if Celso Sickle, LCSWA 09/24/2019, 11:21 AM

## 2019-09-24 NOTE — Lactation Note (Signed)
This note was copied from a baby's chart. Lactation Consultation Note Attempted to see mom, mom sleeping.  Patient Name: [name] Today's Date: 09/24/2019     Maternal Data    Feeding Feeding Type: Breast Fed  LATCH Score Latch: Repeated attempts needed to sustain latch, nipple held in mouth throughout feeding, stimulation needed to elicit sucking reflex.  Audible Swallowing: A few with stimulation  Type of Nipple: Everted at rest and after stimulation  Comfort (Breast/Nipple): Soft / non-tender  Hold (Positioning): Assistance needed to correctly position infant at breast and maintain latch.  LATCH Score: 7  Interventions Interventions: Breast feeding basics reviewed;Assisted with latch;Skin to skin;Hand express  Lactation Tools Discussed/Used     Consult Status      Samantha Mcintosh, Samantha Mcintosh 09/24/2019, 4:40 AM

## 2019-09-24 NOTE — Progress Notes (Signed)
Subjective: Postpartum Day 1: Cesarean Delivery Patient reports tolerating PO, + flatus and no problems voiding.  Pain well controlled. Pt walking around room - cleaning- feels great. She denies any lightheadedness, fever, chills, SOB, CP or HA. She is bonding well with baby. Desires circumcision. No complaints  Objective: Vital signs in last 24 hours: Temp:  [97.8 F (36.6 C)-99.1 F (37.3 C)] 98.9 F (37.2 C) (05/18 3779) Pulse Rate:  [60-104] 99 (05/18 0619) Resp:  [16-28] 18 (05/18 0051) BP: (100-141)/(23-90) 115/90 (05/18 0619) SpO2:  [98 %-100 %] 100 % (05/18 3968)  Physical Exam:  General: alert, cooperative and no distress Lochia: appropriate Uterine Fundus: firm Incision: no significant drainage DVT Evaluation: No evidence of DVT seen on physical exam. No significant calf/ankle edema.  Recent Labs    09/23/19 0201 09/24/19 0621  HGB 12.2 9.6*  HCT 37.2 30.1*    Assessment/Plan: Status post Cesarean section. Doing well postoperatively.  Continue current care Circumcision discussed - all questions answered.  Cathrine Muster 09/24/2019, 10:10 AM

## 2019-09-25 MED ORDER — IBUPROFEN 800 MG PO TABS: 800.0000 mg | ORAL_TABLET | Freq: Four times a day (QID) | ORAL | 0 refills | Status: DC

## 2019-09-25 NOTE — Lactation Note (Signed)
This note was copied from a baby's chart. Lactation Consultation Note  Patient Name: Samantha Mcintosh RCBUL'A Date: 09/25/2019 Reason for consult: Follow-up assessment  Mother is a P32 , infant is 74 hours old and is now at 4% wt loss.  .  Mother reports that infant is feeding well.  Reviewed postioning and using good support with mother.   Reviewed treatment and prevention of engorgement.   Mother to continue to cue base feed infant and feed at least 8-12 times or more in 24 hours and advised to allow for cluster feeding infant as needed.   Mother to continue to due STS. Mother is aware of available LC services at [facility], BFSG'S, OP Dept, and phone # for questions or concerns about breastfeeding.  Mother receptive to all teaching and plan of care.     Maternal Data    Feeding Feeding Type: Breast Fed  LATCH Score Latch: Grasps breast easily, tongue down, lips flanged, rhythmical sucking.  Audible Swallowing: Spontaneous and intermittent  Type of Nipple: Everted at rest and after stimulation  Comfort (Breast/Nipple): Soft / non-tender  Hold (Positioning): Assistance needed to correctly position infant at breast and maintain latch.  LATCH Score: 9  Interventions Interventions: Support pillows;Position options;Ice  Lactation Tools Discussed/Used     Consult Status Consult Status: Complete    Michel Bickers 09/25/2019, 12:52 PM

## 2019-09-25 NOTE — Lactation Note (Signed)
This note was copied from a baby's chart. Lactation Consultation Note  Patient Name: Samantha Mcintosh ASNKN'L Date: 09/25/2019 Reason for consult: Follow-up assessment;1st time breastfeeding;Term;Infant weight loss P1, 32 hour term female infant. Per mom, she feels breastfeeding is going well. Infant had 4 voids and 3 stools since birth. LC in room, infant was cuing to breastfeed, mom latched infant on her left breast using the football hold, infant had deep latch swallows observed, infant was still breastfeeding after 10 minutes when LC left the room. Mom knows to call RN or LC if she needs assistance with latching infant at breast. Infant has currently been cluster feeding, mom has been BF on demand, mom understands not to go past 3 hours without breastfeeding infant.  Mom made aware of O/P services, breastfeeding support groups, community resources, and our phone # for post-discharge questions.  Maternal Data    Feeding Feeding Type: Breast Fed  LATCH Score Latch: Grasps breast easily, tongue down, lips flanged, rhythmical sucking.  Audible Swallowing: Spontaneous and intermittent  Type of Nipple: Everted at rest and after stimulation  Comfort (Breast/Nipple): Soft / non-tender  Hold (Positioning): Assistance needed to correctly position infant at breast and maintain latch.  LATCH Score: 9  Interventions Interventions: Breast feeding basics reviewed;Breast compression;Assisted with latch;Adjust position;Skin to skin;Support pillows;Breast massage;Position options;Hand express;Expressed milk;Hand pump  Lactation Tools Discussed/Used     Consult Status Consult Status: Follow-up Date: 09/25/19 Follow-up type: In-patient    Danelle Earthly 09/25/2019, 5:39 AM

## 2019-09-25 NOTE — Discharge Instructions (Signed)
As per discharge pamphlet °

## 2019-09-25 NOTE — Discharge Summary (Signed)
Postpartum Discharge Summary      Patient Name: Samantha Mcintosh DOB: Aug 06, 1990 MRN: [id number]  Date of admission: 09/23/2019 Delivery date:09/23/2019  Delivering provider: Deliah Boston  Date of discharge: 09/25/2019  Admitting diagnosis: Pregnancy [Z34.90] Intrauterine pregnancy: [redacted]w[redacted]d     Secondary diagnosis:  Active Problems:   Pregnancy   Arrest of descent, delivered, current hospitalization    Discharge diagnosis: Term Pregnancy Delivered and arrest of descent                                               Hospital course: Induction of Labor With Cesarean Section   29 y.o. yo [id number] at [redacted]w[redacted]d was admitted to the hospital 09/23/2019 for induction of labor. Patient had a labor course significant for prolonged 2nd stage. The patient went for cesarean section due to Arrest of Descent. Delivery details are as follows: Membrane Rupture Time/Date: 5:36 PM ,09/23/2019   Delivery Method:C-Section, Vacuum Assisted  Details of operation can be found in separate operative Note.  Patient had an uncomplicated postpartum course. She is ambulating, tolerating a regular diet, passing flatus, and urinating well.  Patient is discharged home in stable condition on 09/25/19.      Newborn Data: Birth date:09/23/2019  Birth time:9:18 PM  Gender:Female  Living status:Living  Apgars:7 ,8  Weight:3737 g                                   Physical exam  Vitals:   09/24/19 1030 09/24/19 1325 09/24/19 2240 09/25/19 0613  BP: 118/75 121/68 112/60 (!) 105/57  Pulse: 95 93 97 96  Resp: 17 18 18 18   Temp: 98.4 F (36.9 C) 98.7 F (37.1 C) 98.5 F (36.9 C) 97.9 F (36.6 C)  TempSrc: Oral Oral Oral Oral  SpO2: 99% 100%    Weight:      Height:       General: alert Lochia: appropriate Uterine Fundus: firm Incision: Healing well with no significant drainage  Labs: Lab Results  Component Value Date   WBC 8.7 09/24/2019   HGB 9.6 (L) 09/24/2019   HCT 30.1 (L) 09/24/2019   MCV 91.2  09/24/2019   PLT 206 09/24/2019   CMP Latest Ref Rng & Units 05/07/2018  Glucose 70 - 99 mg/dL 95  BUN 6 - 20 mg/dL 6  Creatinine 0.44 - 1.00 mg/dL 0.92  Sodium 135 - 145 mmol/L 139  Potassium 3.5 - 5.1 mmol/L 3.6  Chloride 98 - 111 mmol/L 104  CO2 22 - 32 mmol/L 26  Calcium 8.9 - 10.3 mg/dL 9.5  Total Protein 6.5 - 8.1 g/dL -  Total Bilirubin 0.3 - 1.2 mg/dL -  Alkaline Phos 38 - 126 U/L -  AST 15 - 41 U/L -  ALT 14 - 54 U/L -   Edinburgh Score: Edinburgh Postnatal Depression Scale Screening Tool 09/24/2019  I have been able to laugh and see the funny side of things. 0  I have looked forward with enjoyment to things. 0  I have blamed myself unnecessarily when things went wrong. 1  I have been anxious or worried for no good reason. 0  I have felt scared or panicky for no good reason. 0  Things have been getting on top of me. 1  I have  been so unhappy that I have had difficulty sleeping. 0  I have felt sad or miserable. 0  I have been so unhappy that I have been crying. 0  The thought of harming myself has occurred to me. 0  Edinburgh Postnatal Depression Scale Total 2      After visit meds:  Allergies as of 09/25/2019   No Known Allergies     Medication List    STOP taking these medications   brompheniramine-pseudoephedrine-DM 30-2-10 MG/5ML syrup   methylPREDNISolone 4 MG tablet Commonly known as: MEDROL DOSEPAK   oxyCODONE-acetaminophen 7.5-325 MG tablet Commonly known as: Percocet   valACYclovir 1000 MG tablet Commonly known as: VALTREX     TAKE these medications   fluticasone 50 MCG/ACT nasal spray Commonly known as: FLONASE Place 2 sprays into the nose daily.   ibuprofen 800 MG tablet Commonly known as: ADVIL Take 1 tablet (800 mg total) by mouth every 6 (six) hours.   Prenatal 27-1 MG Tabs Take 1 tablet by mouth daily.        Discharge home in stable condition Infant Feeding: Breast Infant Disposition:home with mother Discharge  instruction: per After Visit Summary and Postpartum booklet. Activity: Advance as tolerated. Pelvic rest for 6 weeks.  Diet: routine diet  Postpartum Appointment:2 weeks  Follow up Visit: Follow-up Information    Shivaji, Valerie Roys, MD. Schedule an appointment as soon as possible for a visit in 2 week(s).   Specialty: Obstetrics and Gynecology Contact information: [address] [place] [address] [phone]               09/25/2019 Zenaida Niece, MD

## 2019-09-25 NOTE — Progress Notes (Signed)
POD #2 LTCS Doing well, would like abd binder, ready to go home Afeb, VSS Abd- soft, fundus firm, incision intact D/c home

## 2019-09-27 ENCOUNTER — Inpatient Hospital Stay (HOSPITAL_COMMUNITY)
Admission: EM | Admit: 2019-09-27 | Discharge: 2019-09-27 | Disposition: A | Discharge status: Home / Self Care | Payer: Medicaid Other | Attending: Obstetrics & Gynecology | Admitting: Obstetrics & Gynecology

## 2019-09-27 ENCOUNTER — Other Ambulatory Visit: Payer: Self-pay

## 2019-09-27 ENCOUNTER — Encounter (HOSPITAL_COMMUNITY): Payer: Self-pay | Admitting: *Deleted

## 2019-09-27 DIAGNOSIS — Z4801 Encounter for change or removal of surgical wound dressing: Secondary | ICD-10-CM | POA: Diagnosis not present

## 2019-09-27 DIAGNOSIS — Z98891 History of uterine scar from previous surgery: Secondary | ICD-10-CM | POA: Diagnosis not present

## 2019-09-27 DIAGNOSIS — Z5189 Encounter for other specified aftercare: Secondary | ICD-10-CM | POA: Diagnosis not present

## 2019-09-27 NOTE — ED Provider Notes (Signed)
MSE was initiated and I personally evaluated the patient and placed orders (if any) at  1:08 AM on Sep 27, 2019.  The patient appears stable so that the remainder of the MSE may be completed by another provider.  Samantha Mcintosh is a 29 y.o. female presents with concerns about her c-section incision.  Pt reports she is 4 days post-partum with a c-section on 09/23/2019.  She reports some of the bandage has come off and it has been bleeding. Moderate pain which patient is treating with ibuprofen.  No fever, chills or vomiting.  Face to face Exam:   General: Awake  HEENT: Atraumatic  Resp: Normal effort  Abd: Nondistended  Skin: incision appears intact, bandages somewhat in place; no brisk bleeding.  BP (!) 141/81 (BP Location: Right Arm)   Pulse 93   Temp 98.1 F (36.7 C) (Oral)   Resp 18   SpO2 100%    1:09 AM Discussed with Hilda Lias, MAU APP who accepts patient in transfer.      Visit for wound check     Samantha Mcintosh, [name] 09/27/19 0110    Palumbo, April, MD 09/27/19 0130

## 2019-09-27 NOTE — Discharge Instructions (Signed)
Cesarean Delivery, Care After This sheet gives you information about how to care for yourself after your procedure. Your health care provider may also give you more specific instructions. If you have problems or questions, contact your health care provider. What can I expect after the procedure? After the procedure, it is common to have:  A small amount of blood or clear fluid coming from the incision.  Some redness, swelling, and pain in your incision area.  Some abdominal pain and soreness.  Vaginal bleeding (lochia). Even though you did not have a vaginal delivery, you will still have vaginal bleeding and discharge.  Pelvic cramps.  Fatigue. You may have pain, swelling, and discomfort in the tissue between your vagina and your anus (perineum) if:  Your C-section was unplanned, and you were allowed to labor and push.  An incision was made in the area (episiotomy) or the tissue tore during attempted vaginal delivery. Follow these instructions at home: Incision care   Follow instructions from your health care provider about how to take care of your incision. Make sure you: ? Wash your hands with soap and water before you change your bandage (dressing). If soap and water are not available, use hand sanitizer. ? If you have a dressing, change it or remove it as told by your health care provider. ? Leave stitches (sutures), skin staples, skin glue, or adhesive strips in place. These skin closures may need to stay in place for 2 weeks or longer. If adhesive strip edges start to loosen and curl up, you may trim the loose edges. Do not remove adhesive strips completely unless your health care provider tells you to do that.  Check your incision area every day for signs of infection. Check for: ? More redness, swelling, or pain. ? More fluid or blood. ? Warmth. ? Pus or a bad smell.  Do not take baths, swim, or use a hot tub until your health care provider says it's okay. Ask your health  care provider if you can take showers.  When you cough or sneeze, hug a pillow. This helps with pain and decreases the chance of your incision opening up (dehiscing). Do this until your incision heals. Medicines  Take over-the-counter and prescription medicines only as told by your health care provider.  If you were prescribed an antibiotic medicine, take it as told by your health care provider. Do not stop taking the antibiotic even if you start to feel better.  Do not drive or use heavy machinery while taking prescription pain medicine. Lifestyle  Do not drink alcohol. This is especially important if you are breastfeeding or taking pain medicine.  Do not use any products that contain nicotine or tobacco, such as cigarettes, e-cigarettes, and chewing tobacco. If you need help quitting, ask your health care provider. Eating and drinking  Drink at least 8 eight-ounce glasses of water every day unless told not to by your health care provider. If you breastfeed, you may need to drink even more water.  Eat high-fiber foods every day. These foods may help prevent or relieve constipation. High-fiber foods include: ? Whole grain cereals and breads. ? Brown rice. ? Beans. ? Fresh fruits and vegetables. Activity   If possible, have someone help you care for your baby and help with household activities for at least a few days after you leave the hospital.  Return to your normal activities as told by your health care provider. Ask your health care provider what activities are safe for   you.  Rest as much as possible. Try to rest or take a nap while your baby is sleeping.  Do not lift anything that is heavier than 10 lbs (4.5 kg), or the limit that you were told, until your health care provider says that it is safe.  Talk with your health care provider about when you can engage in sexual activity. This may depend on your: ? Risk of infection. ? How fast you heal. ? Comfort and desire to  engage in sexual activity. General instructions  Do not use tampons or douches until your health care provider approves.  Wear loose, comfortable clothing and a supportive and well-fitting bra.  Keep your perineum clean and dry. Wipe from front to back when you use the toilet.  If you pass a blood clot, save it and call your health care provider to discuss. Do not flush blood clots down the toilet before you get instructions from your health care provider.  Keep all follow-up visits for you and your baby as told by your health care provider. This is important. Contact a health care provider if:  You have: ? A fever. ? Bad-smelling vaginal discharge. ? Pus or a bad smell coming from your incision. ? Difficulty or pain when urinating. ? A sudden increase or decrease in the frequency of your bowel movements. ? More redness, swelling, or pain around your incision. ? More fluid or blood coming from your incision. ? A rash. ? Nausea. ? Little or no interest in activities you used to enjoy. ? Questions about caring for yourself or your baby.  Your incision feels warm to the touch.  Your breasts turn red or become painful or hard.  You feel unusually sad or worried.  You vomit.  You pass a blood clot from your vagina.  You urinate more than usual.  You are dizzy or light-headed. Get help right away if:  You have: ? Pain that does not go away or get better with medicine. ? Chest pain. ? Difficulty breathing. ? Blurred vision or spots in your vision. ? Thoughts about hurting yourself or your baby. ? New pain in your abdomen or in one of your legs. ? A severe headache.  You faint.  You bleed from your vagina so much that you fill more than one sanitary pad in one hour. Bleeding should not be heavier than your heaviest period. Summary  After the procedure, it is common to have pain at your incision site, abdominal cramping, and slight bleeding from your vagina.  Check  your incision area every day for signs of infection.  Tell your health care provider about any unusual symptoms.  Keep all follow-up visits for you and your baby as told by your health care provider. This information is not intended to replace advice given to you by your health care provider. Make sure you discuss any questions you have with your health care provider. Document Revised: 11/01/2017 Document Reviewed: 11/01/2017 Elsevier Patient Education  2020 [employer] Inc[employer]  

## 2019-09-27 NOTE — MAU Note (Addendum)
Pt had c/s on 09/23/19 due to arrest of decent. Comes in this am with incision concerns. Some of honeycomb dsg has come off and is bloody. Only pain is mild incisional soreness. Pt states has been very busy at home cleaning and such and has not sat down much today

## 2019-09-27 NOTE — Progress Notes (Signed)
Wynelle Bourgeois CNM in to see pt and remove dsging that is partly off. Incision edges well approximated and no drainage. CNM removed steristrips as well as some were coming loose. Pt tol well and feels better to have dsg off

## 2019-09-27 NOTE — MAU Provider Note (Signed)
Chief Complaint:  Wound Check   First Provider Initiated Contact with Patient 09/27/19 0145      HPI: Samantha Mcintosh is a 29 y.o. [id number] who presents to maternity admissions reporting dressing from C/S done 4 days ago to be partially coming off and having blood stains on it.  Got worried so came in for evaluation. No pain now.  Intermittent incisional soreness relieved by ibuprofen. She reports scant vaginal bleeding, no vaginal itching/burning, urinary symptoms, h/a, dizziness, n/v, or fever/chills.    Wound Check She was originally treated 3 to 5 days ago. Prior ED Treatment: Cesarean Section. Her temperature was unmeasured prior to arrival. There has been bloody discharge from the wound. There is no redness present. There is no swelling present. The pain has improved. She has no difficulty moving the affected extremity or digit.   RN Note: Pt had c/s on 09/23/19 due to arrest of decent. Comes in this am with incision concerns. Some of honeycomb dsg has come off and is bloody. Only pain is mild incisional soreness. Pt states has been very busy at home cleaning and such and has not sat down much today  Past Medical History: Past Medical History:  Diagnosis Date  . Depression    PP not treated resolved after 3-4 mos  . HSV (herpes simplex virus) infection     Past obstetric history: OB History  Gravida Para Term Preterm AB Living  3 2 2  0 1 2  SAB TAB Ectopic Multiple Live Births  0 1 0 0 2    # Outcome Date GA Lbr Len/2nd Weight Sex Delivery Anes PTL Lv  3 Term 09/23/19 [redacted]w[redacted]d 02:15 / 01:33 3737 g M CS-Vac EPI  LIV  2 TAB 12/17/12          1 Term 05/21/09 [redacted]w[redacted]d  3402 g M Vag-Spont EPI N LIV    Past Surgical History: Past Surgical History:  Procedure Laterality Date  . CESAREAN SECTION N/A 09/23/2019   Procedure: CESAREAN SECTION;  Surgeon: Deliah Boston, MD;  Location: [facility];  Service: Obstetrics;  Laterality: N/A;  . INDUCED ABORTION    . WISDOM TOOTH EXTRACTION        Family History: Family History  Problem Relation Age of Onset  . Healthy Mother   . Healthy Father     Social History: Social History   Tobacco Use  . Smoking status: Never Smoker  . Smokeless tobacco: Never Used  Substance Use Topics  . Alcohol use: Not Currently  . Drug use: Never    Allergies: No Known Allergies  Meds:  Medications Prior to Admission  Medication Sig Dispense Refill Last Dose  . fluticasone (FLONASE) 50 MCG/ACT nasal spray Place 2 sprays into the nose daily. (Patient not taking: Reported on 05/08/2018) 1 g 2   . ibuprofen (ADVIL) 800 MG tablet Take 1 tablet (800 mg total) by mouth every 6 (six) hours. 30 tablet 0   . Prenatal 27-1 MG TABS Take 1 tablet by mouth daily.       I have reviewed patient's Past Medical Hx, Surgical Hx, Family Hx, Social Hx, medications and allergies.  ROS:  Review of Systems  Constitutional: Negative for appetite change, chills and fever.  Respiratory: Negative for shortness of breath.   Gastrointestinal: Negative for abdominal pain, constipation, diarrhea and nausea.  Genitourinary: Positive for vaginal bleeding. Negative for dysuria, flank pain, pelvic pain and vaginal discharge.  Musculoskeletal: Negative for back pain.  Neurological: Negative for dizziness,  weakness and light-headedness.   Other systems negative    Physical Exam   Patient Vitals for the past 24 hrs:  BP Temp Temp src Pulse Resp SpO2  09/27/19 0141 -- 98.3 F (36.8 C) -- -- 18 --  09/27/19 0052 (!) 141/81 98.1 F (36.7 C) Oral 93 18 100 %   Constitutional: Well-developed, well-nourished female in no acute distress.  Cardiovascular: normal rate and rhythm Respiratory: normal effort, no distress.   GI: Abd soft, appropriately tender.  Nondistended.  No rebound, No guarding.         Honeycomb dressing halfway off, removed.  Steristrips removed.  No erethema.  No dehiscence.  Unable to express any serosanguinous fluid from incision.   MS:  Extremities nontender, no edema, normal ROM Neurologic: Alert and oriented x 4.   Grossly nonfocal. GU: Neg CVAT. Skin:  Warm and Dry Psych:  Affect appropriate.     PELVIC EXAM: deferred   Labs: No results found for this or any previous visit (from the past 24 hour(s)). --/--/O POS, O POS Performed at [facility], [address] [place], [place] [address]  (05/17 0201)  Imaging:  No results found.  MAU Course/MDM: I have ordered labs as follows: none Imaging ordered: none .   Treatments in MAU included removal of honeycomb dressing and steristrips which were partially detatched.  Dry dressing placed for comfort and to catch any drainage. .   Pt stable at time of discharge.  Assessment: 1. Visit for wound check   2.      Normal postop wound healing, no sign of erethema or dehiscence  Plan: Discharge home Recommend Keep wound clean and dry Followup in office as scheduled  Encouraged to return here or to other Urgent Care/ED if she develops worsening of symptoms, increase in pain, fever, or other concerning symptoms.   Wynelle Bourgeois CNM, MSN Certified Nurse-Midwife 09/27/2019 1:45 AM

## 2019-09-27 NOTE — ED Triage Notes (Signed)
Pt reports c section on the 17th, says her honeycomb dressing has come off and has blood on it.

## 2020-02-27 IMAGING — DX DG CHEST 2V
2 series · 2 of 2 positions shown · non-contrast
Comparison: None.

CLINICAL DATA: Cardiac palpitations with chest heaviness

EXAM:
CHEST - 2 VIEW

[chest pa]
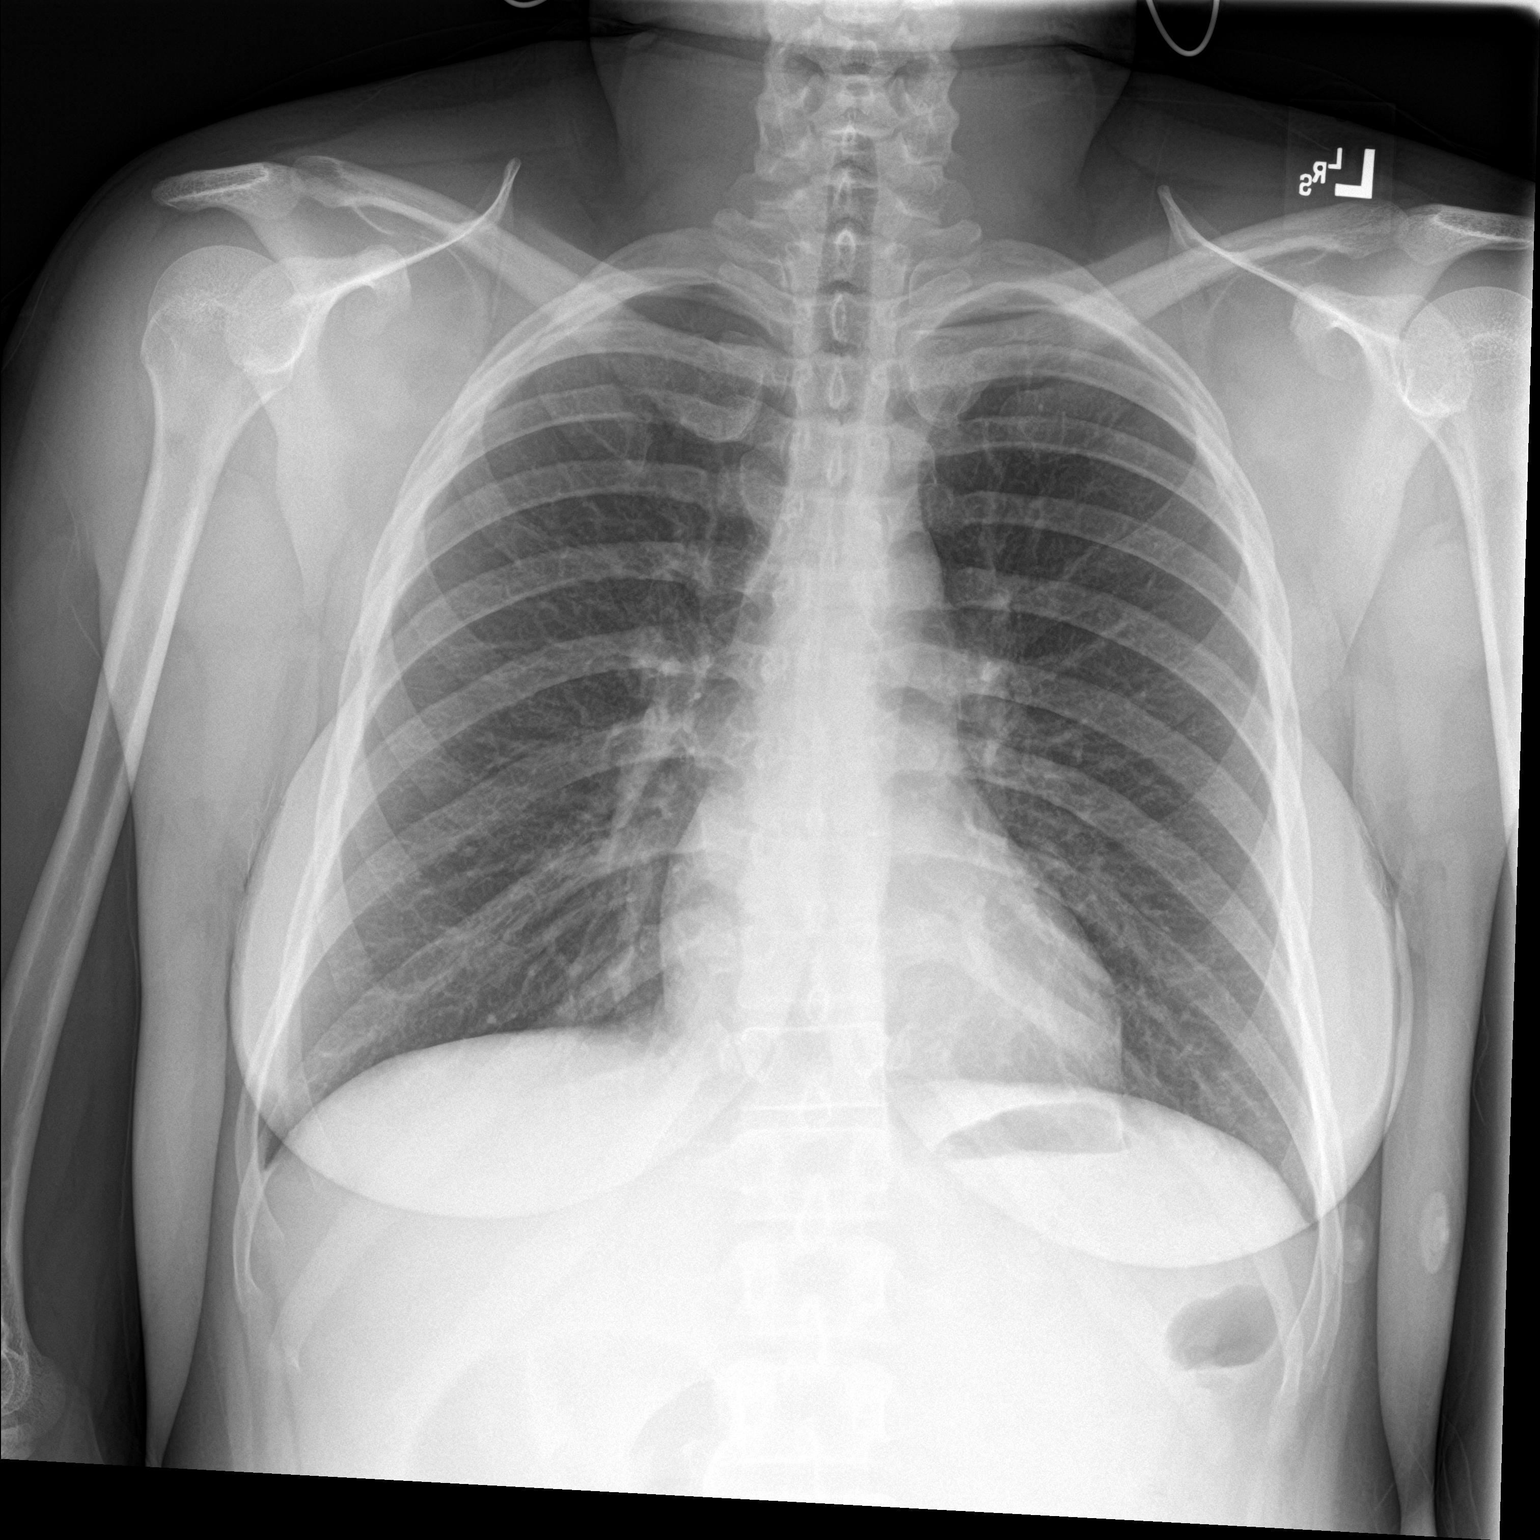

[chest lat]
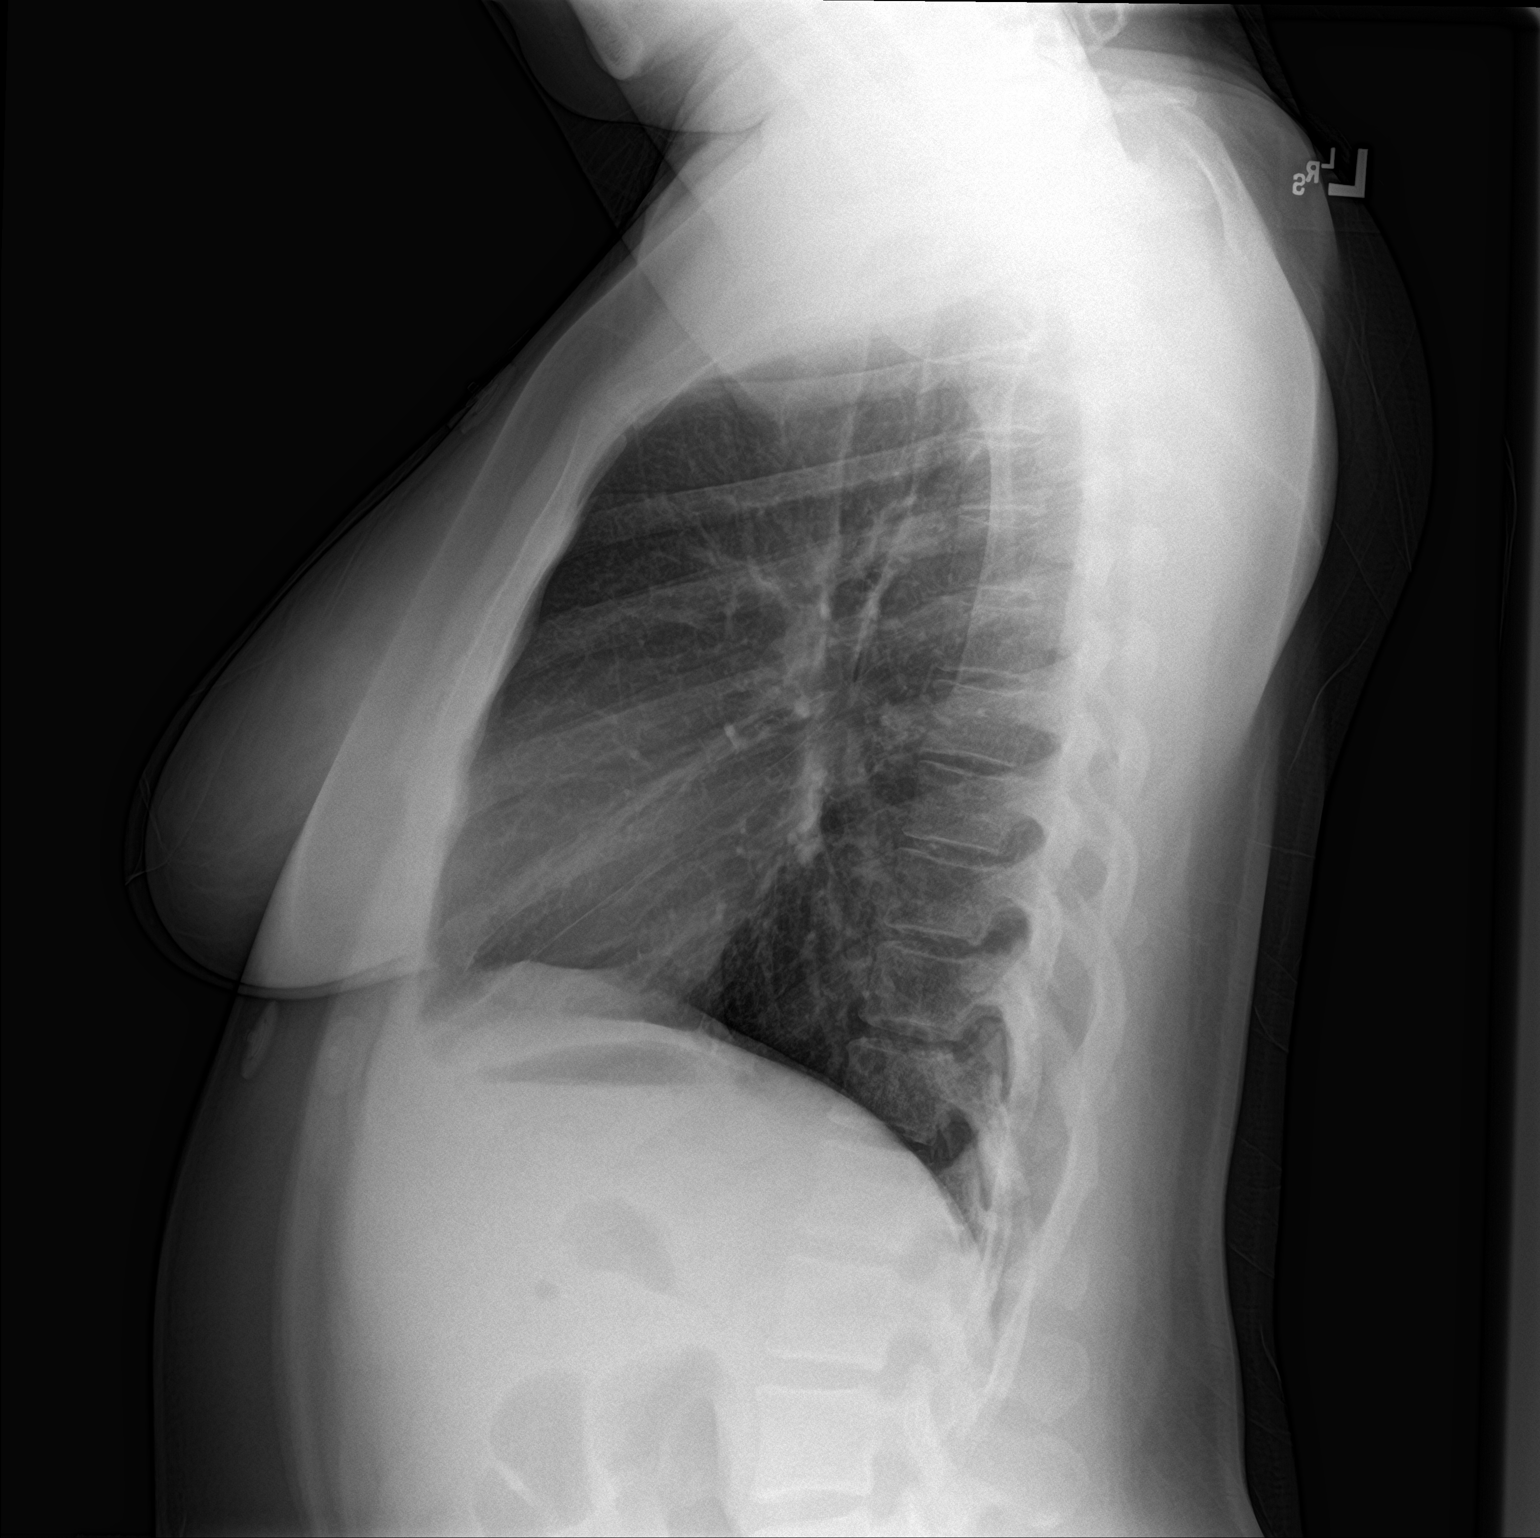

[2 of 2 positions shown; findings below may reference images not displayed]

FINDINGS: Lungs are clear. Heart size and pulmonary vascularity are normal. No
adenopathy. No pneumothorax. No bone lesions.
IMPRESSION: No abnormality noted.

## 2020-04-17 ENCOUNTER — Other Ambulatory Visit: Payer: Self-pay

## 2020-04-17 ENCOUNTER — Emergency Department (HOSPITAL_COMMUNITY)
Admission: EM | Admit: 2020-04-17 | Discharge: 2020-04-17 | Disposition: A | Discharge status: Home / Self Care | Payer: 59 | Attending: Emergency Medicine | Admitting: Emergency Medicine

## 2020-04-17 ENCOUNTER — Emergency Department (HOSPITAL_COMMUNITY): Payer: 59

## 2020-04-17 ENCOUNTER — Encounter (HOSPITAL_COMMUNITY): Payer: Self-pay

## 2020-04-17 DIAGNOSIS — Z20822 Contact with and (suspected) exposure to covid-19: Secondary | ICD-10-CM | POA: Insufficient documentation

## 2020-04-17 DIAGNOSIS — N201 Calculus of ureter: Secondary | ICD-10-CM | POA: Diagnosis not present

## 2020-04-17 DIAGNOSIS — N23 Unspecified renal colic: Secondary | ICD-10-CM | POA: Insufficient documentation

## 2020-04-17 DIAGNOSIS — R109 Unspecified abdominal pain: Secondary | ICD-10-CM | POA: Diagnosis present

## 2020-04-17 LAB — URINALYSIS, ROUTINE W REFLEX MICROSCOPIC
Bilirubin Urine: NEGATIVE
Glucose, UA: NEGATIVE mg/dL
Hgb urine dipstick: NEGATIVE
Ketones, ur: NEGATIVE mg/dL
Leukocytes,Ua: NEGATIVE
Nitrite: NEGATIVE
Protein, ur: 30 mg/dL — AB
Specific Gravity, Urine: 1.025 (ref 1.005–1.030)
pH: 7 (ref 5.0–8.0)

## 2020-04-17 LAB — RESP PANEL BY RT-PCR (FLU A&B, COVID) ARPGX2
Influenza A by PCR: NEGATIVE
Influenza B by PCR: NEGATIVE
SARS Coronavirus 2 by RT PCR: NEGATIVE

## 2020-04-17 LAB — I-STAT BETA HCG BLOOD, ED (MC, WL, AP ONLY): I-stat hCG, quantitative: <5 m[IU]/mL (ref <5)

## 2020-04-17 MED ORDER — HYDROMORPHONE HCL 1 MG/ML IJ SOLN
1.0000 mg | Freq: Once | INTRAMUSCULAR | Status: AC
  Administered 2020-04-17: 1 mg via INTRAVENOUS
  Filled 2020-04-17: qty 1

## 2020-04-17 MED ORDER — SODIUM CHLORIDE 0.9 % IV SOLN: INTRAVENOUS | Status: DC

## 2020-04-17 MED ORDER — KETOROLAC TROMETHAMINE 30 MG/ML IJ SOLN
30.0000 mg | Freq: Once | INTRAMUSCULAR | Status: AC
  Administered 2020-04-17: 30 mg via INTRAVENOUS
  Filled 2020-04-17: qty 1

## 2020-04-17 MED ORDER — ONDANSETRON 8 MG PO TBDP
8.0000 mg | ORAL_TABLET | Freq: Once | ORAL | Status: AC
  Administered 2020-04-17: 8 mg via ORAL
  Filled 2020-04-17: qty 1

## 2020-04-17 MED ORDER — TAMSULOSIN HCL 0.4 MG PO CAPS: ORAL_CAPSULE | ORAL | 0 refills | Status: AC

## 2020-04-17 MED ORDER — HYDROCODONE-ACETAMINOPHEN 5-325 MG PO TABS: 1.0000 | ORAL_TABLET | ORAL | 0 refills | Status: AC | PRN

## 2020-04-17 NOTE — ED Notes (Signed)
Patient states nausea is better but would like something for the flank pain. MD notfied

## 2020-04-17 NOTE — Discharge Instructions (Signed)
Use the prescription medicines as directed.  They were sent to your pharmacy.  Strain your urine to catch the stone that you will know its out.  Do not drive or work when taking the narcotic pain reliever.

## 2020-04-17 NOTE — ED Triage Notes (Signed)
Pt state son exposed to covid @ daycare yesterday, son tested but results not back. Patient started having n/v @ 5 am with right flank pain and decreased appetite. x3 emesis with ems. Tylenol and gas-x @ 0530 with no relieve per patient

## 2020-04-17 NOTE — ED Provider Notes (Signed)
[facility]-EMERGENCY DEPT Provider Note   CSN: [id number] Arrival date & time: 04/17/20  2263     History Chief Complaint  Patient presents with  . Emesis  . Covid Exposure    Samantha Mcintosh is a 29 y.o. female.  HPI She presents for evaluation of left flank pain with nausea and vomiting which started yesterday.  Symptoms ongoing and persistent.  She is concerned about possible Covid exposure via her son who goes to a daycare where a client had a Covid diagnosis; she was informed about that yesterday.  Patient's child's been having reflux vomiting, after eating for about a week and a half.  He has not had a fever.  Patient denies having fever.  She has not been having any cough or chest pain.  No prior history of kidney stones.  She denies dysuria, urinary frequency, hematuria, fever, chills, weakness or dizziness.  There are no other known modifying factors.    Past Medical History:  Diagnosis Date  . Depression    PP not treated resolved after 3-4 mos  . HSV (herpes simplex virus) infection     Patient Active Problem List   Diagnosis Date Noted  . Pregnancy 09/23/2019  . Arrest of descent, delivered, current hospitalization 09/23/2019    Past Surgical History:  Procedure Laterality Date  . CESAREAN SECTION N/A 09/23/2019   Procedure: CESAREAN SECTION;  Surgeon: Carlisle Cater, MD;  Location: [facility] LD [facility]S;  Service: Obstetrics;  Laterality: N/A;  . INDUCED ABORTION    . WISDOM TOOTH EXTRACTION       OB History    Gravida  3   Para  2   Term  2   Preterm  0   AB  1   Living  2     SAB  0   IAB  1   Ectopic  0   Multiple  0   Live Births  2           Family History  Problem Relation Age of Onset  . Healthy Mother   . Healthy Father     Social History   Tobacco Use  . Smoking status: Never Smoker  . Smokeless tobacco: Never Used  Vaping Use  . Vaping Use: Never used  Substance Use Topics  . Alcohol use: Not  Currently  . Drug use: Never    Home Medications Prior to Admission medications   Medication Sig Start Date End Date Taking? Authorizing Provider  fluticasone (FLONASE) 50 MCG/ACT nasal spray Place 2 sprays into the nose daily. Patient not taking: Reported on 05/08/2018 03/02/13   Graylon Good, PA-C  ibuprofen (ADVIL) 800 MG tablet Take 1 tablet (800 mg total) by mouth every 6 (six) hours. 09/25/19   Meisinger, Tawanna Cooler, MD  Prenatal 27-1 MG TABS Take 1 tablet by mouth daily. 09/11/19   [provider]    Allergies    Patient has no known allergies.  Review of Systems   Review of Systems  All other systems reviewed and are negative.   Physical Exam Updated Vital Signs BP (!) 153/87 (BP Location: Left Arm)   Pulse (!) 56 Comment: patient in bed resting with eyes closed and blanket now  Temp 98 F (36.7 C) (Oral)   Resp 17   Ht 5\' 5"  (1.651 m)   Wt 100 kg   SpO2 100%   BMI 36.69 kg/m   Physical Exam Vitals and nursing note reviewed.  Constitutional:  General: She is in acute distress (Uncomfortable, restless).     Appearance: She is well-developed and well-nourished. She is not ill-appearing.  HENT:     Head: Normocephalic and atraumatic.  Eyes:     Extraocular Movements: EOM normal.     Conjunctiva/sclera: Conjunctivae normal.     Pupils: Pupils are equal, round, and reactive to light.  Neck:     Trachea: Phonation normal.  Cardiovascular:     Rate and Rhythm: Normal rate.  Pulmonary:     Effort: Pulmonary effort is normal.  Abdominal:     General: There is no distension.     Tenderness: There is no guarding.  Genitourinary:    Comments: Left ankle nontender to self-palpation Musculoskeletal:        General: Normal range of motion.     Cervical back: Normal range of motion and neck supple.  Skin:    General: Skin is warm and dry.  Neurological:     Mental Status: She is alert and oriented to person, place, and time.     Motor: No abnormal muscle  tone.  Psychiatric:        Mood and Affect: Mood and affect and mood normal.        Behavior: Behavior normal.        Thought Content: Thought content normal.        Judgment: Judgment normal.     ED Results / Procedures / Treatments   Labs (all labs ordered are listed, but only abnormal results are displayed) Labs Reviewed  RESP PANEL BY RT-PCR (FLU A&B, COVID) [id number]    EKG None  Radiology No results found.  Procedures Procedures (including critical care time)  Medications Ordered in ED Medications  ondansetron (ZOFRAN-ODT) disintegrating tablet 8 mg (8 mg Oral Given 04/17/20 9563)    ED Course  I have reviewed the triage vital signs and the nursing notes.  Pertinent labs & imaging results that were available during my care of the patient were reviewed by me and considered in my medical decision making (see chart for details).  Clinical Course as of 04/17/20 0836  Fri Apr 17, 2020  8756 She has been treated with Zofran for nausea and vomiting [EW]  0805 Resp Panel by RT-PCR (Flu A&B, Covid) Nasopharyngeal Swab [EW]    Clinical Course User Index [EW] Mancel Bale, MD   MDM Rules/Calculators/A&P                           Patient Vitals for the past 24 hrs:  BP Temp Temp src Pulse Resp SpO2 Height Weight  04/17/20 0738 -- -- -- (!) 56 17 100 % 5\' 5"  (1.651 m) 100 kg  04/17/20 0735 (!) 153/87 98 F (36.7 C) Oral (!) 130 17 99 % -- --    At time of discharge-Reevaluation with update and discussion. After initial assessment and treatment, an updated evaluation reveals she is comfortable and pain has resolved. 14/10/21   Medical Decision Making:  This patient is presenting for evaluation of left flank pain with vomiting, which does require a range of treatment options, and is a complaint that involves a high risk of morbidity and mortality. The differential diagnoses include UTI, kidney stone, myalgia, intestinal disorder. I decided to review old records,  and in summary healthy young female presenting with vomiting and flank pain.  I did not require additional historical information from anyone.  Clinical Laboratory Tests Ordered, included Urinalysis  and Covid test. Review indicates increased red and white cells in urine with just a few bacteria. Radiologic Tests Ordered, included CT abdomen pelvis.  I independently Visualized: CT images, which show 5 mm left distal ureteral stone, with mild obstructive uropathy.    Critical Interventions-clinical evaluation, medication treatment, CT imaging, observation reassessment. Repeat treatment for pain  After These Interventions, the Patient was reevaluated and was found stable for discharge. Patient with distal ureteral stone, could likely account for passage, stable for discharge home  CRITICAL CARE-no Performed by: Mancel Bale  Nursing Notes Reviewed/ Care Coordinated Applicable Imaging Reviewed Interpretation of Laboratory Data incorporated into ED treatment  The patient appears reasonably screened and/or stabilized for discharge and I doubt any other medical condition or other [facility] requiring further screening, evaluation, or treatment in the ED at this time prior to discharge.  Plan: Home Medications-OTC medicines of choice; Home Treatments-strain urine, rest, fluids; return here if the recommended treatment, does not improve the symptoms; Recommended follow up-neurology follow-up 1 week     Final Clinical Impression(s) / ED Diagnoses Final diagnoses:  Renal colic on left side  Left ureteral stone    Rx / DC Orders ED Discharge Orders    None       Mancel Bale, MD 04/18/20 2055

## 2022-02-07 IMAGING — CT CT RENAL STONE PROTOCOL
2 of 4 series · 16 of 46 positions shown, 18 images · non-contrast
Comparison: 05/29/2016

CLINICAL DATA: Exposed to [id number] infection. Nausea vomiting and
right flank pain beginning this morning.

EXAM:
CT ABDOMEN AND PELVIS WITHOUT CONTRAST
TECHNIQUE: Multidetector CT imaging of the abdomen and pelvis was performed
following the standard protocol without IV contrast.

[Series 2: axial st · axial · 0.68mm/px · z∈[-456,-76]mm · 13 of 86 slices shown, 15 images]
[im 5/86  soft-tissue]
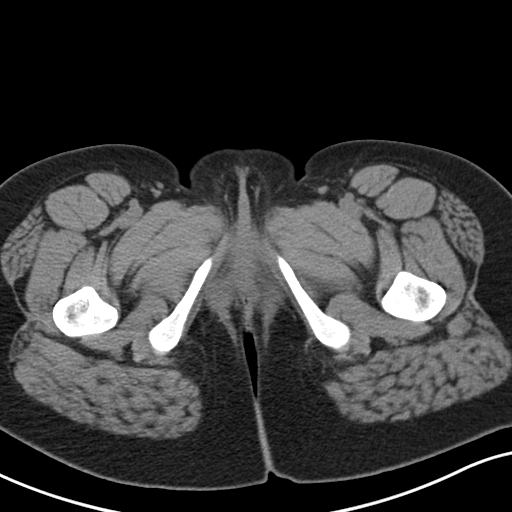
[im 5/86  bone]
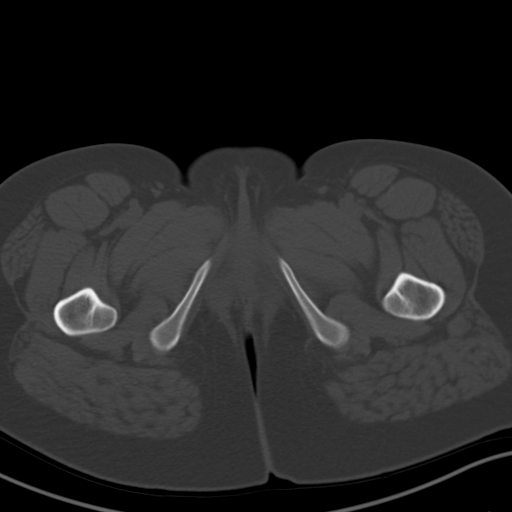
[im 13/86  soft-tissue]
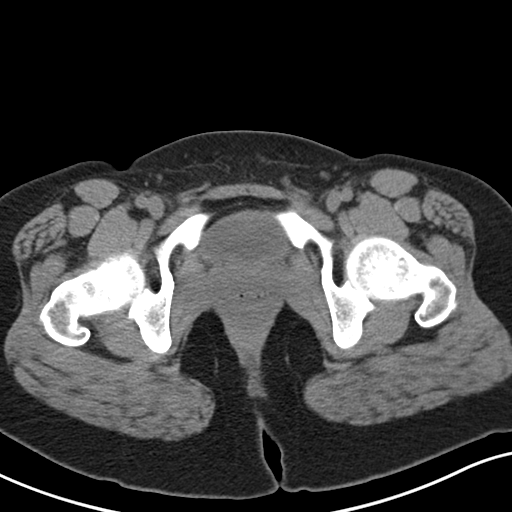
[im 17/86  soft-tissue]
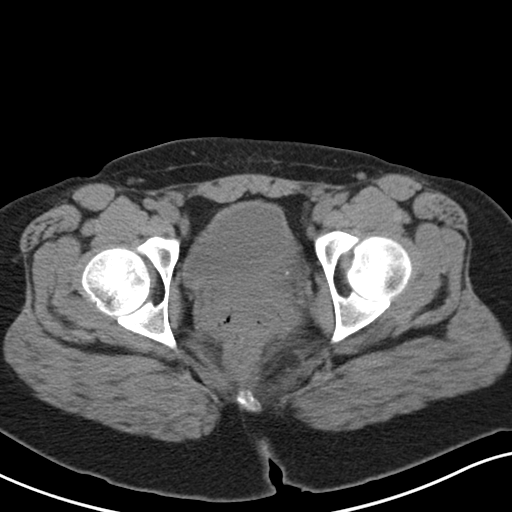
[im 25/86  soft-tissue]
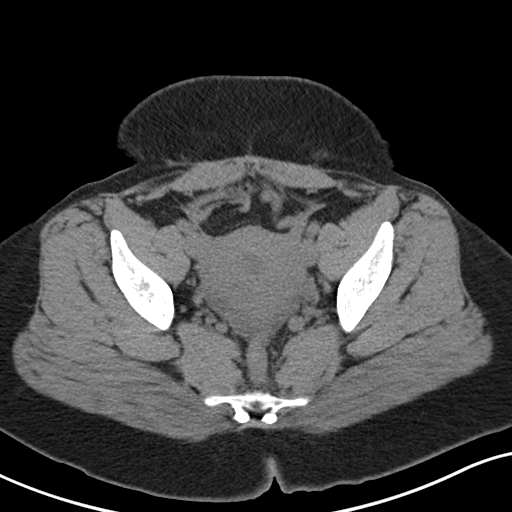
[im 29/86  soft-tissue]
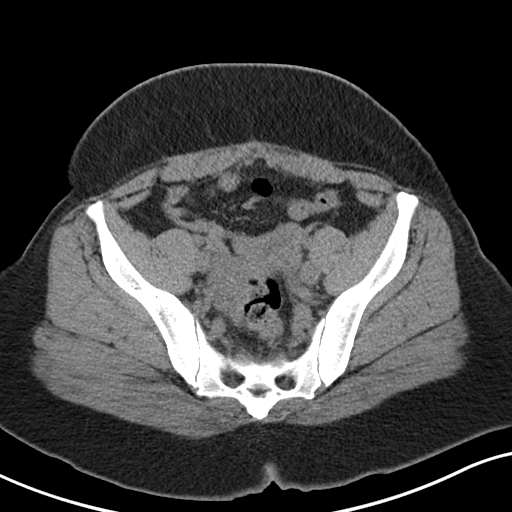
[im 37/86  soft-tissue]
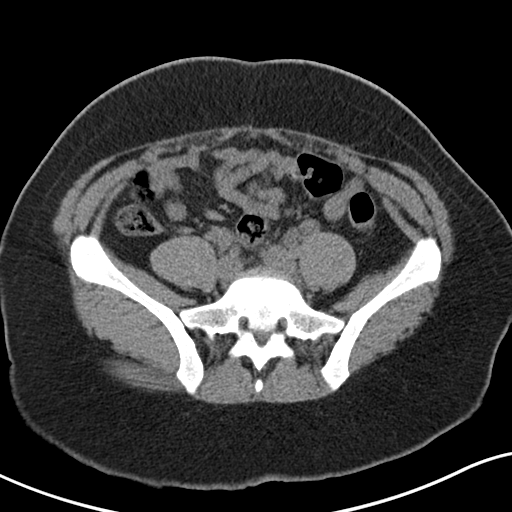
[im 45/86  soft-tissue]
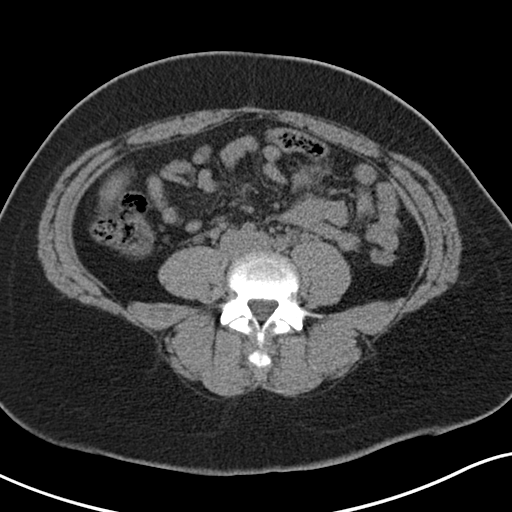
[im 49/86  soft-tissue]
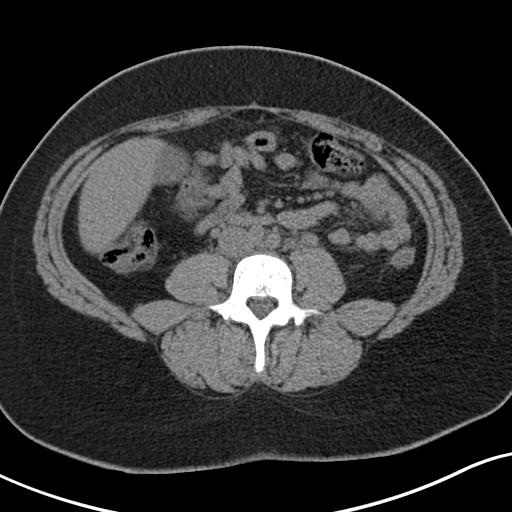
[im 57/86  soft-tissue]
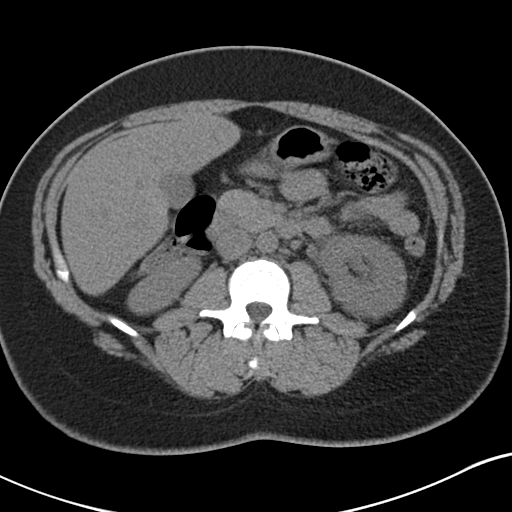
[im 57/86  bone]
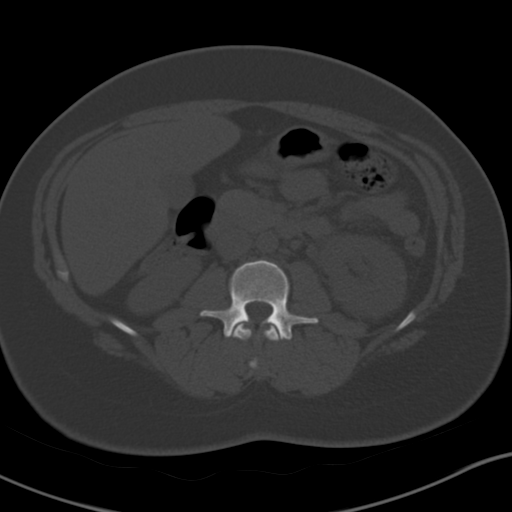
[im 61/86  soft-tissue]
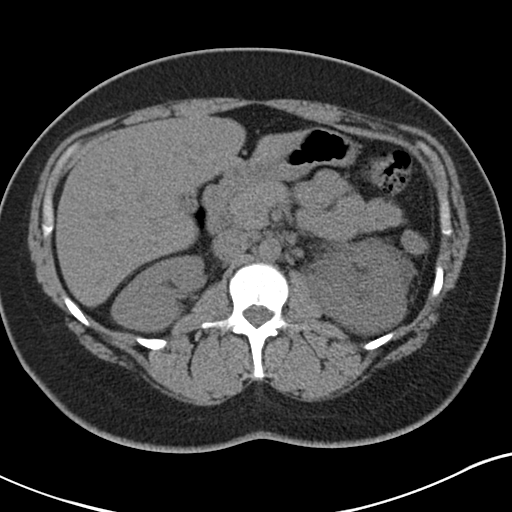
[im 69/86  soft-tissue]
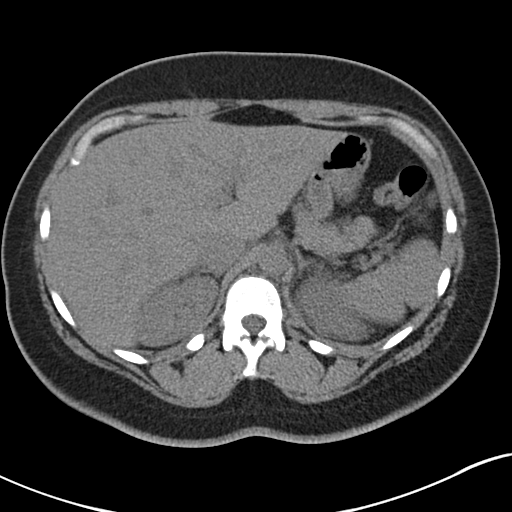
[im 73/86  soft-tissue]
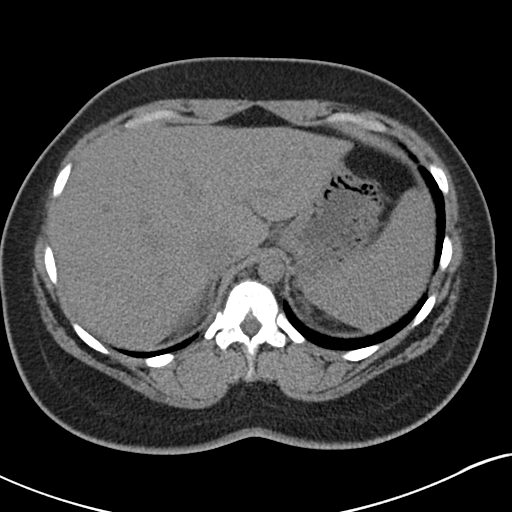
[im 81/86  soft-tissue]
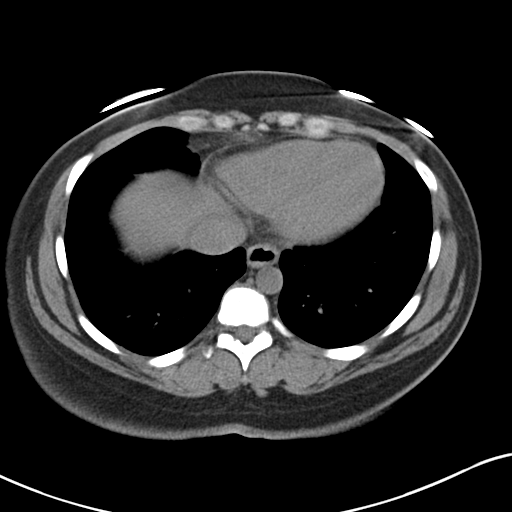

[Series 4: coronal · coronal · 0.75mm/px · 3 of 160 slices shown]
[im 54/160  soft-tissue]
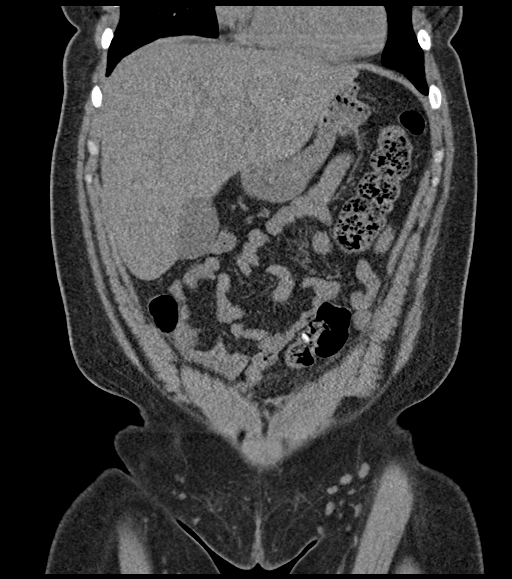
[im 71/160  soft-tissue]
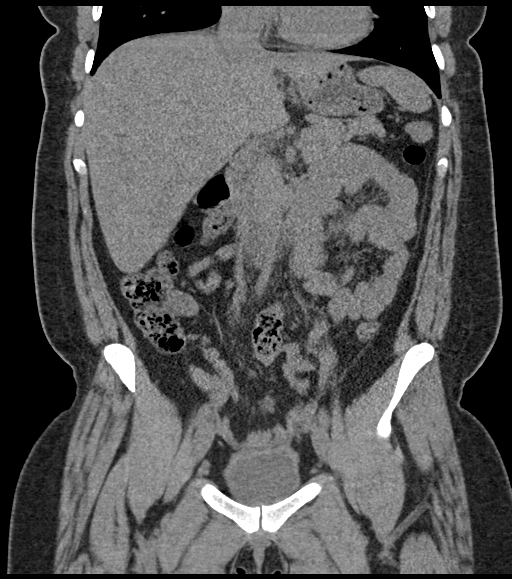
[im 89/160  soft-tissue]
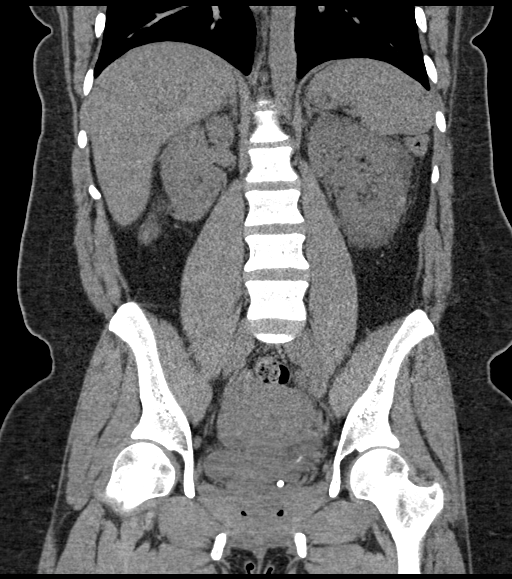

[16 of 46 positions shown; findings below may reference images not displayed]

FINDINGS: Lower chest: Clear lung bases.  Heart is normal in size.

Hepatobiliary: No focal liver abnormality is seen. No gallstones,
gallbladder wall thickening, or biliary dilatation.

Pancreas: Unremarkable. No pancreatic ductal dilatation or
surrounding inflammatory changes.

Spleen: Normal in size without focal abnormality.

Adrenals/Urinary Tract: No adrenal masses.

Mild left hydronephrosis with significant left perinephric stranding
and fluid attenuation. Mild dilation of the left ureter with left
Peri ureteral stranding. These findings are due to a 5 mm stone in
the distal left ureter.

No right renal collecting system dilation or obstructive changes. No
renal masses. No intrarenal stones. Normal right ureter.

Bladder is unremarkable.

Stomach/Bowel: Stomach is within normal limits. Appendix appears
normal. No evidence of bowel wall thickening, distention, or
inflammatory changes.

Vascular/Lymphatic: No significant vascular findings are present. No
enlarged abdominal or pelvic lymph nodes.

Reproductive: Uterus and bilateral adnexa are unremarkable.

Other: No abdominal wall hernia or abnormality. No abdominopelvic
ascites.

Musculoskeletal: No acute or significant osseous findings.
IMPRESSION: 1. 5 mm stone in the distal left ureter causes left obstructive
uropathy with mild left hydroureteronephrosis and significant left
perinephric stranding.
2. No other abnormality in the abdomen or pelvis.

## 2022-07-08 ENCOUNTER — Other Ambulatory Visit: Payer: Self-pay | Admitting: Family Medicine

## 2022-07-08 DIAGNOSIS — E049 Nontoxic goiter, unspecified: Secondary | ICD-10-CM

## 2022-08-03 ENCOUNTER — Other Ambulatory Visit: Payer: Medicaid Other

## 2022-08-18 ENCOUNTER — Ambulatory Visit
Admission: RE | Admit: 2022-08-18 | Discharge: 2022-08-18 | Disposition: A | Discharge status: Home / Self Care | Payer: Medicaid Other | Source: Ambulatory Visit | Attending: Family Medicine | Admitting: Family Medicine

## 2022-08-18 DIAGNOSIS — E049 Nontoxic goiter, unspecified: Secondary | ICD-10-CM
